# Patient Record
Sex: Female | Born: 1960 | Race: Black or African American | Hispanic: No | Marital: Single | Smoking: Never smoker
Health system: Southern US, Community
[De-identification: ages and names within clinical notes are randomized; demographics above are authoritative.]

## PROBLEM LIST (undated history)

## (undated) DIAGNOSIS — D219 Benign neoplasm of connective and other soft tissue, unspecified: Secondary | ICD-10-CM

## (undated) DIAGNOSIS — N920 Excessive and frequent menstruation with regular cycle: Secondary | ICD-10-CM

## (undated) DIAGNOSIS — D649 Anemia, unspecified: Secondary | ICD-10-CM

## (undated) DIAGNOSIS — M1990 Unspecified osteoarthritis, unspecified site: Secondary | ICD-10-CM

## (undated) DIAGNOSIS — N6009 Solitary cyst of unspecified breast: Secondary | ICD-10-CM

## (undated) DIAGNOSIS — IMO0001 Reserved for inherently not codable concepts without codable children: Secondary | ICD-10-CM

## (undated) DIAGNOSIS — K219 Gastro-esophageal reflux disease without esophagitis: Secondary | ICD-10-CM

## (undated) DIAGNOSIS — B009 Herpesviral infection, unspecified: Secondary | ICD-10-CM

## (undated) DIAGNOSIS — E669 Obesity, unspecified: Secondary | ICD-10-CM

## (undated) HISTORY — DX: Benign neoplasm of connective and other soft tissue, unspecified: D21.9

## (undated) HISTORY — PX: WISDOM TOOTH EXTRACTION: SHX21

## (undated) HISTORY — DX: Herpesviral infection, unspecified: B00.9

## (undated) HISTORY — DX: Solitary cyst of unspecified breast: N60.09

## (undated) HISTORY — DX: Anemia, unspecified: D64.9

## (undated) HISTORY — PX: OTHER SURGICAL HISTORY: SHX169

## (undated) HISTORY — PX: BREAST EXCISIONAL BIOPSY: SUR124

## (undated) HISTORY — PX: UPPER GASTROINTESTINAL ENDOSCOPY: SHX188

## (undated) HISTORY — PX: ABDOMINAL HYSTERECTOMY: SHX81

## (undated) HISTORY — DX: Obesity, unspecified: E66.9

---

## 1995-07-23 HISTORY — PX: OTHER SURGICAL HISTORY: SHX169

## 2003-05-31 ENCOUNTER — Other Ambulatory Visit: Admission: RE | Admit: 2003-05-31 | Discharge: 2003-05-31 | Payer: Self-pay | Admitting: Obstetrics and Gynecology

## 2003-06-30 ENCOUNTER — Ambulatory Visit (HOSPITAL_COMMUNITY): Admission: RE | Admit: 2003-06-30 | Discharge: 2003-06-30 | Payer: Self-pay | Admitting: Obstetrics and Gynecology

## 2005-06-21 ENCOUNTER — Encounter (INDEPENDENT_AMBULATORY_CARE_PROVIDER_SITE_OTHER): Payer: Self-pay | Admitting: *Deleted

## 2005-06-21 LAB — CONVERTED CEMR LAB

## 2005-07-17 ENCOUNTER — Ambulatory Visit: Payer: Self-pay | Admitting: Family Medicine

## 2005-08-22 ENCOUNTER — Ambulatory Visit (HOSPITAL_COMMUNITY): Admission: RE | Admit: 2005-08-22 | Discharge: 2005-08-22 | Payer: Self-pay | Admitting: Family Medicine

## 2005-09-11 ENCOUNTER — Encounter: Admission: RE | Admit: 2005-09-11 | Discharge: 2005-09-11 | Payer: Self-pay | Admitting: Family Medicine

## 2005-10-02 ENCOUNTER — Ambulatory Visit: Payer: Self-pay | Admitting: Family Medicine

## 2006-02-21 ENCOUNTER — Encounter: Admission: RE | Admit: 2006-02-21 | Discharge: 2006-02-21 | Payer: Self-pay | Admitting: Sports Medicine

## 2006-02-24 ENCOUNTER — Ambulatory Visit: Payer: Self-pay | Admitting: Sports Medicine

## 2006-07-11 ENCOUNTER — Ambulatory Visit: Payer: Self-pay | Admitting: Family Medicine

## 2006-09-18 DIAGNOSIS — D259 Leiomyoma of uterus, unspecified: Secondary | ICD-10-CM | POA: Insufficient documentation

## 2006-09-19 ENCOUNTER — Encounter (INDEPENDENT_AMBULATORY_CARE_PROVIDER_SITE_OTHER): Payer: Self-pay | Admitting: *Deleted

## 2006-10-13 ENCOUNTER — Ambulatory Visit (HOSPITAL_COMMUNITY): Admission: RE | Admit: 2006-10-13 | Discharge: 2006-10-13 | Payer: Self-pay | Admitting: Internal Medicine

## 2006-10-13 ENCOUNTER — Encounter (INDEPENDENT_AMBULATORY_CARE_PROVIDER_SITE_OTHER): Payer: Self-pay | Admitting: Family Medicine

## 2006-10-16 ENCOUNTER — Encounter (INDEPENDENT_AMBULATORY_CARE_PROVIDER_SITE_OTHER): Payer: Self-pay | Admitting: Family Medicine

## 2006-10-17 ENCOUNTER — Encounter (INDEPENDENT_AMBULATORY_CARE_PROVIDER_SITE_OTHER): Payer: Self-pay | Admitting: Family Medicine

## 2007-10-19 ENCOUNTER — Encounter: Payer: Self-pay | Admitting: Obstetrics & Gynecology

## 2007-10-19 ENCOUNTER — Ambulatory Visit: Payer: Self-pay | Admitting: Obstetrics & Gynecology

## 2007-11-02 ENCOUNTER — Ambulatory Visit (HOSPITAL_COMMUNITY): Admission: RE | Admit: 2007-11-02 | Discharge: 2007-11-02 | Payer: Self-pay | Admitting: Gynecology

## 2009-02-24 ENCOUNTER — Ambulatory Visit (HOSPITAL_COMMUNITY): Admission: RE | Admit: 2009-02-24 | Discharge: 2009-02-24 | Payer: Self-pay | Admitting: Family Medicine

## 2009-02-27 ENCOUNTER — Ambulatory Visit: Payer: Self-pay | Admitting: Obstetrics & Gynecology

## 2009-06-14 ENCOUNTER — Ambulatory Visit: Payer: Self-pay | Admitting: Obstetrics & Gynecology

## 2009-06-14 LAB — CONVERTED CEMR LAB
BUN: 10 mg/dL (ref 6–23)
CO2: 22 meq/L (ref 19–32)
Calcium: 8.8 mg/dL (ref 8.4–10.5)
Chloride: 104 meq/L (ref 96–112)
Cholesterol: 160 mg/dL (ref 0–200)
Creatinine, Ser: 0.76 mg/dL (ref 0.40–1.20)
Glucose, Bld: 89 mg/dL (ref 70–99)
HCT: 30.6 % — ABNORMAL LOW (ref 36.0–46.0)
HDL: 51 mg/dL (ref >39)
Hemoglobin: 8.6 g/dL — ABNORMAL LOW (ref 12.0–15.0)
LDL Cholesterol: 100 mg/dL — ABNORMAL HIGH (ref 0–99)
MCHC: 28.1 g/dL — ABNORMAL LOW (ref 30.0–36.0)
MCV: 63 fL — ABNORMAL LOW (ref 78.0–100.0)
Platelets: 270 10*3/uL (ref 150–400)
Potassium: 3.8 meq/L (ref 3.5–5.3)
RBC: 4.86 M/uL (ref 3.87–5.11)
RDW: 18.6 % — ABNORMAL HIGH (ref 11.5–15.5)
Sodium: 137 meq/L (ref 135–145)
TSH: 1.017 microintl units/mL (ref 0.350–4.500)
Total CHOL/HDL Ratio: 3.1
Triglycerides: 45 mg/dL (ref <150)
VLDL: 9 mg/dL (ref 0–40)
WBC: 2.8 10*3/uL — ABNORMAL LOW (ref 4.0–10.5)

## 2010-02-13 ENCOUNTER — Encounter: Payer: Self-pay | Admitting: Family Medicine

## 2010-03-06 ENCOUNTER — Ambulatory Visit (HOSPITAL_COMMUNITY): Admission: RE | Admit: 2010-03-06 | Discharge: 2010-03-06 | Payer: Self-pay | Admitting: Obstetrics & Gynecology

## 2010-09-05 ENCOUNTER — Encounter (INDEPENDENT_AMBULATORY_CARE_PROVIDER_SITE_OTHER): Payer: Self-pay | Admitting: *Deleted

## 2010-09-05 ENCOUNTER — Encounter: Payer: Self-pay | Admitting: Family Medicine

## 2010-09-05 ENCOUNTER — Ambulatory Visit (INDEPENDENT_AMBULATORY_CARE_PROVIDER_SITE_OTHER): Payer: BC Managed Care – PPO | Admitting: Family Medicine

## 2010-09-05 ENCOUNTER — Other Ambulatory Visit: Payer: Self-pay | Admitting: Family Medicine

## 2010-09-05 DIAGNOSIS — R1310 Dysphagia, unspecified: Secondary | ICD-10-CM

## 2010-09-05 DIAGNOSIS — Z136 Encounter for screening for cardiovascular disorders: Secondary | ICD-10-CM

## 2010-09-05 LAB — HEPATIC FUNCTION PANEL
ALT: 7 U/L (ref 0–35)
AST: 14 U/L (ref 0–37)
Albumin: 3.6 g/dL (ref 3.5–5.2)
Alkaline Phosphatase: 41 U/L (ref 39–117)
Bilirubin, Direct: 0.1 mg/dL (ref 0.0–0.3)
Total Bilirubin: 0.6 mg/dL (ref 0.3–1.2)
Total Protein: 7 g/dL (ref 6.0–8.3)

## 2010-09-05 LAB — LIPID PANEL
Cholesterol: 162 mg/dL (ref 0–200)
HDL: 49.8 mg/dL (ref >39.00)
LDL Cholesterol: 107 mg/dL — ABNORMAL HIGH (ref 0–99)
Total CHOL/HDL Ratio: 3
Triglycerides: 28 mg/dL (ref 0.0–149.0)
VLDL: 5.6 mg/dL (ref 0.0–40.0)

## 2010-09-05 LAB — BASIC METABOLIC PANEL
BUN: 12 mg/dL (ref 6–23)
CO2: 26 mEq/L (ref 19–32)
Calcium: 9.1 mg/dL (ref 8.4–10.5)
Chloride: 108 mEq/L (ref 96–112)
Creatinine, Ser: 0.7 mg/dL (ref 0.4–1.2)
GFR: 111.53 mL/min (ref >60.00)
Glucose, Bld: 85 mg/dL (ref 70–99)
Potassium: 4.2 mEq/L (ref 3.5–5.1)
Sodium: 140 mEq/L (ref 135–145)

## 2010-09-05 LAB — TSH: TSH: 1.02 u[IU]/mL (ref 0.35–5.50)

## 2010-09-12 NOTE — Assessment & Plan Note (Signed)
Summary: NEW PT TO EST/THROAT/CLE  [employer]   Vital Signs:  Patient profile:   50 year old female Height:      64 inches Weight:      198.25 pounds BMI:     34.15 Temp:     98.4 degrees F oral Pulse rate:   75 / minute Pulse rhythm:   regular BP sitting:   120 / 74  (right arm) Cuff size:   large  Vitals Entered By: Linde Gillis CMA Duncan Dull) (September 05, 2010 11:13 AM) CC: new patient, establish care   History of Present Illness: 50 yo to establish care with complaint of dysphagia.  Started almost a year ago. Comes and goes, swallowing often difficult and painful. Solids but not liquids seem to get stuck. Referred to [facility] in 01/2010.  Per pt had a neg laryngoscopy.  Placed on Omeprazole 20 mg prior to meals which has not helped with her symptoms. Denies any nausea or vomiting. Often does have issues with constipation.  Pt is a non smoker.   Does not drink ETOH. No weight loss, fever or night sweats.  Current Medications (verified): 1)  Omeprazole 20 Mg Cpdr (Omeprazole) .... 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 Eyes:  Denies blurring. ENT:  Complains of difficulty swallowing. CV:  Denies chest pain or discomfort. Resp:  Denies shortness of breath. GI:  Complains of constipation; denies abdominal pain and bloody stools. GU:  Denies abnormal  vaginal bleeding, discharge, and dysuria. MS:  Denies joint pain, joint redness, and joint swelling. Derm:  Denies rash. Neuro:  Denies headaches. Psych:  Denies anxiety and depression. Endo:  Denies cold intolerance and heat intolerance. Heme:  Denies abnormal bruising and bleeding.  Physical Exam  General:  alert, well-developed, and overweight-appearing.   Head:  normocephalic, atraumatic, and no abnormalities observed.   Eyes:  vision grossly intact, pupils equal, and pupils round.   Ears:  R ear normal and L ear normal.   Nose:  no external deformity.   Mouth:  good dentition and pharynx pink and moist.   Neck:  supple and full ROM.   enlarged but non tender thyroid Lungs:  Normal respiratory effort, chest expands symmetrically. Lungs are clear to auscultation, no crackles or wheezes. Heart:  Normal rate and regular rhythm. S1 and S2 normal without gallop, murmur, click, rub or other extra sounds. Abdomen:  Bowel sounds positive,abdomen soft and non-tender without masses, organomegaly or hernias noted. Msk:  normal ROM.   Extremities:  thick ankles but no edema Neurologic:  alert & oriented X3 and gait normal.   Skin:  Intact without suspicious lesions or rashes Psych:  Cognition and judgment appear intact. Alert and cooperative with normal attention span and concentration. No apparent delusions, illusions, hallucinations   Impression & Recommendations:  Problem # 1:  DYSPHAGIA UNSPECIFIED (ICD-787.20) Assessment New Will check labs including TSH to rule out  possible contributing factors. Refer to GI for further workup, ?possbile esophageal stricture/achalasia. Pt in agreement with plan. Orders: TLB-BMP (Basic Metabolic Panel-BMET) (80048-METABOL) TLB-Hepatic/Liver Function Pnl (80076-HEPATIC) TLB-TSH (Thyroid Stimulating Hormone) ([address]-UEA) Gastroenterology Referral (GI)  Complete Medication List: 1)  Omeprazole 20 Mg Cpdr (Omeprazole) .... Take one tablet by mouth  daily before meals  Other Orders: Venipuncture (54098) TLB-Lipid Panel (80061-LIPID)  Patient Instructions: 1)  Great to meet you! 2)  Please stop by to see Shirlee Limerick on your way out.   Orders Added: 1)  Venipuncture [36415] 2)  TLB-Lipid Panel [80061-LIPID] 3)  TLB-BMP (Basic Metabolic Panel-BMET) [80048-METABOL] 4)  TLB-Hepatic/Liver Function Pnl [80076-HEPATIC] 5)  TLB-TSH (Thyroid Stimulating Hormone) [84443-TSH] 6)  Gastroenterology Referral [GI] 7)  New Patient Level III [11914]    Current Allergies (reviewed today): No known allergies   Last Mammogram:  ASSESSMENT: Negative - BI-RADS 1^MM DIGITAL SCREENING (03/06/2010 3:21:00 PM) Mammogram Result Date:  03/06/2010 Mammogram Result:  normal

## 2010-09-12 NOTE — Letter (Signed)
Summary: New Patient letter  [facility]  [address] [place], [place] [address]   Phone: [phone]  Fax: [phone]       09/05/2010 MRN: [id number]  Katherine Harris [address] [place], [place]  [address]  [place]  Dear Katherine Harris,  Welcome to the Gastroenterology Division at [facility].    You are scheduled to see Dr.  Russella Dar on 10-15-10 at 2:30P.M. on the 3rd floor at [facility], [address].  We ask that you try to arrive at our office 15 minutes prior to your appointment time to allow for check-in.  We would like you to complete the enclosed self-administered evaluation form prior to your visit and bring it with you on the day of your appointment.  We will review it with you.  Also, please bring a complete list of all your medications or, if you prefer, bring the medication bottles and we will list them.  Please bring your insurance card so that we may make a copy of it.  If your insurance requires a referral to see a specialist, please bring your referral form from your primary care physician.  Co-payments are due at the time of your visit and may be paid by cash, check or credit card.     Your office visit will consist of a consult with your physician (includes a physical exam), any laboratory testing he/she may order, scheduling of any necessary diagnostic testing (e.g. x-ray, ultrasound, CT-scan), and scheduling of a procedure (e.g. Endoscopy, Colonoscopy) if required.  Please allow enough time on your schedule to allow for any/all of these possibilities.    If you cannot keep your appointment, please call [phone] to cancel or reschedule prior to your appointment date.  This allows [place] the opportunity to schedule an appointment for another patient in need of care.  If you do not cancel or reschedule by 5 p.m. the business day prior to your appointment date, you will be charged a $50.00 late cancellation/no-show fee.    Thank you for choosing  [facility] for your medical needs.  We appreciate the opportunity to care for you.  Please visit [place] at our website  to learn more about our practice.                     Sincerely,                                                             The Gastroenterology Division

## 2010-09-20 HISTORY — PX: OTHER SURGICAL HISTORY: SHX169

## 2010-09-27 NOTE — Letter (Signed)
Summary: [facility]  [facility]   Imported By: Maryln Gottron 09/19/2010 11:29:21  _____________________________________________________________________  External Attachment:    Type:   Image     Comment:   External Document

## 2010-10-15 ENCOUNTER — Encounter: Payer: Self-pay | Admitting: Gastroenterology

## 2010-10-15 ENCOUNTER — Ambulatory Visit (INDEPENDENT_AMBULATORY_CARE_PROVIDER_SITE_OTHER): Payer: BC Managed Care – PPO | Admitting: Gastroenterology

## 2010-10-15 VITALS — BP 110/72 | HR 72 | Ht 63.0 in | Wt 198.8 lb

## 2010-10-15 DIAGNOSIS — Z1211 Encounter for screening for malignant neoplasm of colon: Secondary | ICD-10-CM

## 2010-10-15 DIAGNOSIS — R1310 Dysphagia, unspecified: Secondary | ICD-10-CM

## 2010-10-15 DIAGNOSIS — R1319 Other dysphagia: Secondary | ICD-10-CM

## 2010-10-15 NOTE — Progress Notes (Signed)
History of Present Illness: This is a  50 year old female who relates an 8 or 9 month history of frequent solid food dysphagia. She notes symptoms related to meats and bread. She was evaluated at [facility] and underwent triple endoscopy that was apparently normal. She states she was placed on omeprazole at that time and has had no change in symptoms.  I do not have records available from her ENT evaluation. She has not previously noted any reflux symptoms, chest pain, abdominal pain, change in bowel habit,s melena, hematochezia, change in stool caliber, weight loss, nausea or vomiting.  Past Medical History  Diagnosis Date  . Breast cyst    Past Surgical History  Procedure Date  . Cesarean section 07/22/1990    reports that she has never smoked. She does not have any smokeless tobacco history on file. She reports that she does not drink alcohol or use illicit drugs. family history includes Breast cancer in her maternal aunt; Hypertension in her mother; Leukemia in her father; Liver cancer in her mother; and Stomach cancer in her maternal aunt.  There is no history of Colon cancer. No Known Allergies   Outpatient Encounter Prescriptions as of 10/15/2010  Medication Sig Dispense Refill  . omeprazole (PRILOSEC) 20 MG capsule Take 20 mg by mouth daily.        Review of Systems: Pertinent positive and negative review of systems were noted in the above HPI section. All other review of systems were otherwise negative.  Physical Exam: General: Well developed , well nourished, no acute distress Head: Normocephalic and atraumatic Eyes:  sclerae anicteric, EOMI Ears: Normal auditory acuity Mouth: No deformity or lesions Neck: Supple, no masses or thyromegaly Lungs: Clear throughout to auscultation Heart: Regular rate and rhythm; no murmurs, rubs or bruits Abdomen: Soft, non tender and non distended. No masses, hepatosplenomegaly or hernias noted. Normal Bowel sounds Musculoskeletal: Symmetrical  with no gross deformities  Skin: No lesions on visible extremities Pulses:  Normal pulses noted Extremities: No clubbing, cyanosis, edema or deformities noted Neurological: Alert oriented x 4, grossly nonfocal Cervical Nodes:  No significant cervical adenopathy Inguinal Nodes: No significant inguinal adenopathy Psychological:  Alert and cooperative. Normal mood and affect

## 2010-10-15 NOTE — Patient Instructions (Signed)
You have been scheduled for a Upper Endoscopy with dilitation.

## 2010-10-15 NOTE — Assessment & Plan Note (Signed)
Average risk for colorectal cancer. Begin screening colonoscopy at age52 , November 2014.

## 2010-10-15 NOTE — Assessment & Plan Note (Addendum)
Several month history of solid food dysphagia. No associated reflux symptoms. Rule out esophageal stricture, and less likely a neoplasm or motility disorder. The risks, benefits, and alternatives to endoscopy with possible biopsy and possible dilation were discussed with the patient and they consent to proceed.

## 2010-11-16 ENCOUNTER — Encounter: Payer: Self-pay | Admitting: Gastroenterology

## 2010-11-19 ENCOUNTER — Ambulatory Visit (AMBULATORY_SURGERY_CENTER): Payer: BC Managed Care – PPO | Admitting: Gastroenterology

## 2010-11-19 ENCOUNTER — Encounter: Payer: Self-pay | Admitting: Gastroenterology

## 2010-11-19 VITALS — BP 132/81 | HR 74 | Temp 98.2°F | Resp 19 | Ht 62.0 in | Wt 198.0 lb

## 2010-11-19 DIAGNOSIS — K2940 Chronic atrophic gastritis without bleeding: Secondary | ICD-10-CM

## 2010-11-19 DIAGNOSIS — R1310 Dysphagia, unspecified: Secondary | ICD-10-CM

## 2010-11-19 DIAGNOSIS — K2970 Gastritis, unspecified, without bleeding: Secondary | ICD-10-CM

## 2010-11-19 DIAGNOSIS — K222 Esophageal obstruction: Secondary | ICD-10-CM

## 2010-11-19 DIAGNOSIS — R1319 Other dysphagia: Secondary | ICD-10-CM

## 2010-11-19 DIAGNOSIS — K219 Gastro-esophageal reflux disease without esophagitis: Secondary | ICD-10-CM

## 2010-11-19 DIAGNOSIS — K2990 Gastroduodenitis, unspecified, without bleeding: Secondary | ICD-10-CM

## 2010-11-19 MED ORDER — SODIUM CHLORIDE 0.9 % IV SOLN: 500.0000 mL | INTRAVENOUS | Status: DC

## 2010-11-19 NOTE — Patient Instructions (Signed)
Please read the handouts given to you by your recovery room nurse.  We will send you a letter within 2 weeks regarding your pathology results.  Please, eat the post dilation diet (soft diet) for the rest of today.  Avoid advil and ibuprofen. Use your anti reflux medications long-term.. Continue your routine medications.   If you have any questions or concerns, please call us at [phone].

## 2010-11-20 ENCOUNTER — Telehealth: Payer: Self-pay

## 2010-11-20 NOTE — Telephone Encounter (Signed)

## 2010-11-25 ENCOUNTER — Encounter: Payer: Self-pay | Admitting: Gastroenterology

## 2010-12-04 NOTE — Assessment & Plan Note (Signed)
NAME:  Katherine Harris, Katherine Harris NO.:  [id number]   MEDICAL RECORD NO.:  [id number]          PATIENT TYPE:  POB   LOCATION:  [facility] at [facility]         FACILITY:  [facility]   PHYSICIAN:  Allie Bossier, MD        DATE OF BIRTH:  Nov 20, 1960   DATE OF SERVICE:  10/19/2007                                  CLINIC NOTE   Ms. Wareing is a 50 year old single white gravida 4, para 2, abortus 2.  She is here for her annual exam.  Her main complaint is some right  shoulder pain that radiates around to her elbow for the last 5 months  and continued weight gain.   PAST MEDICAL HISTORY:  Morbid obesity.   PAST SURGICAL HISTORY:  D&C for an elective AB and a C-section.  No  latex allergies.  No medicine allergies.   SOCIAL HISTORY:  Negative for tobacco, alcohol or drug use.   FAMILY HISTORY:  Positive breast cancer in maternal aunt who is alive  and well but negative for GYN and colon cancer.   REVIEW OF SYSTEMS:  She has been abstinent for last 5 years.  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  She has been having heavy  periods for greater than 2 years.   PHYSICAL EXAM:  Weight 214, height 5 feet 2 inches, blood pressure  99/71, pulse 79.  HEENT:  Normal.  HEART:  Regular rate rhythm.  LUNGS:  Clear to auscultation bilaterally.  Breast exam normal bilaterally.  ABDOMEN:  Obese, benign.  EXTERNAL GENITALIA:  Normal.  No lesions.  Cervix parous, no lesions.  Uterus is enlarged and difficult to quantify due to her tightening of  her abdominal muscles during the exam.  I was unable to palpate her  adnexa.   ASSESSMENT/PLAN:  1. Annual exam.  I have done a Pap smear.  I have recommended self-      breast and self-vulvar exams monthly.  Recommended weight loss and      we discussed specific strategies for that (jump-roping during      commercials as example).  2. Enlarged uterus and heavy periods/menorrhagia.  Will check a CBC      and TSH and schedule a GYN  ultrasound.     Allie Bossier, MD    MCD/MEDQ  D:  10/19/2007  T:  10/19/2007  Job:  [id number]

## 2010-12-04 NOTE — Assessment & Plan Note (Signed)
NAME:  DUSTIN, BURRILL NO.:  [id number]   MEDICAL RECORD NO.:  [id number]          PATIENT TYPE:  POB   LOCATION:  [facility] at [facility]         FACILITY:  [facility]   PHYSICIAN:  Allie Bossier, MD        DATE OF BIRTH:  07/30/1960   DATE OF SERVICE:  06/14/2009                                  CLINIC NOTE   Ms. Hursey is a 50 year old, single black, gravida 3, para 2, abortus  1.  She has an 37 year old son and an 70 year old daughter.  She comes  in here for her annual exam.  She has 2 complaints, one that for the  last several months, she has had difficulty swallowing.  She feels like  there is something stuck in her throat.  The other complaint is that of  urge incontinence.  This has been going on for several years, but it is  becoming worse.  She actually does have urinary accidents.  She denies  stress incontinence.   PAST MEDICAL HISTORY:  Obesity, anemia, fibroids, and HSV II.   REVIEW OF SYSTEMS:  She has been abstinent for the last 5 years.  She  works at the [employer].  She is a Education officer, museum.  Her periods are still heavy.  The remainder of the review of systems  questions are negative, although she does say that she rarely has HSV  outbreaks, but she would like a prescription for some ointment.   MEDICATIONS:  Multivitamin daily.   ALLERGIES:  No known drug allergies.  No to LATEX allergy.   SOCIAL HISTORY:  Negative for tobacco, alcohol, or drug use.   FAMILY HISTORY:  Positive for breast cancer in a maternal aunt, who is  alive and well, but she denies family history of GYN and colon cancer.  Please note that her last hemoglobin done last year was 10.   PHYSICAL EXAMINATION:  VITAL SIGNS:  Weight 195 (she has lost 20 pounds  since last year).  She is 5 feet 2 inches tall, blood pressure 114/83,  pulse 67.  HEENT:  Normal.  HEART:  Regular rate and rhythm.  LUNGS:  Clear to auscultation bilaterally.  BREASTS:  Normal  bilaterally.  ABDOMEN:  Moderately obese.  No palpable hepatosplenomegaly.  EXTERNAL GENITALIA:  No lesions.  Cervix nulliparous.  Normal discharge.  Uterus is 6-week size.  Adnexa nontender.  No masses.   ASSESSMENT AND PLAN:  1. Annual exam.  I have done a Pap smear.  I recommended self-breast      and self-vulvar exams monthly.  I would continue to recommend      weight loss.  2. History of anemia with fibroids and continued menorrhagia.  I will      recheck a CBC today.  3. Difficulty swallowing.  I would recommend that she see an ENT.  In      the meantime, I will check a TSH.  4. General health maintenance.  I am checking fasting lipids and      glucose today.  I have offered her a flu shot she declines.  She      does wish to have  an HIV test.  5. Urge incontinence.  I have given her prescription for Detrol LA 4      mg to be taken daily.  I will see her back in 4-6 weeks to see how      this is doing.  6. herpes simplex virus 2.  I will give her prescription for acyclovir      ointment to be used on a p.r.n. basis.      Allie Bossier, MD     MCD/MEDQ  D:  06/14/2009  T:  06/15/2009  Job:  [id number]

## 2011-03-07 ENCOUNTER — Other Ambulatory Visit: Payer: Self-pay | Admitting: Obstetrics & Gynecology

## 2011-03-07 DIAGNOSIS — Z1231 Encounter for screening mammogram for malignant neoplasm of breast: Secondary | ICD-10-CM

## 2011-03-12 ENCOUNTER — Ambulatory Visit (HOSPITAL_COMMUNITY): Payer: BC Managed Care – PPO

## 2011-03-15 ENCOUNTER — Ambulatory Visit (HOSPITAL_COMMUNITY): Payer: BC Managed Care – PPO

## 2011-04-18 ENCOUNTER — Encounter: Payer: Self-pay | Admitting: Family Medicine

## 2011-04-18 ENCOUNTER — Ambulatory Visit (HOSPITAL_COMMUNITY): Payer: BC Managed Care – PPO

## 2011-04-18 ENCOUNTER — Ambulatory Visit (INDEPENDENT_AMBULATORY_CARE_PROVIDER_SITE_OTHER): Payer: BC Managed Care – PPO | Admitting: Family Medicine

## 2011-04-18 VITALS — BP 110/70 | HR 86 | Temp 98.6°F | Ht 64.0 in | Wt 195.5 lb

## 2011-04-18 DIAGNOSIS — R109 Unspecified abdominal pain: Secondary | ICD-10-CM | POA: Insufficient documentation

## 2011-04-18 DIAGNOSIS — N3941 Urge incontinence: Secondary | ICD-10-CM

## 2011-04-18 DIAGNOSIS — M549 Dorsalgia, unspecified: Secondary | ICD-10-CM

## 2011-04-18 LAB — POCT URINALYSIS DIPSTICK
Bilirubin, UA: 0.2
Glucose, UA: NEGATIVE
Spec Grav, UA: 1.01

## 2011-04-18 NOTE — Patient Instructions (Signed)
Great to see you. Please stop by to see Shirlee Limerick on your way out to set up your referrals.

## 2011-04-18 NOTE — Progress Notes (Signed)
Addended by: Gilmer Mor on: 04/18/2011 04:10 PM   Modules accepted: Orders

## 2011-04-18 NOTE — Progress Notes (Signed)
Subjective:    Patient ID: Katherine Harris, female    DOB: [birth year], 50 y.o.   MRN: [id number]  HPI  Very pleasant 50 yo female here for worsening urinary incontinence and left lower abdominal pain.  Urinary incontinence- has had issues with urge incontinence for two years. OBGYN, Dr. Marice Potter, gave her rx for Detrol but side effects frightened her so she did not try it. Last few months, she is having more problems.  Wakes up in the morning and her underwear are soaked. No dysuria. No fever.  LLQ pain- noticed it several months ago. Comes and goes. No abnormal vaginal bleeding or discharge. Saw OBGYN earlier this year. Not worsened or alleviated by anything.  Patient Active Problem List  Diagnoses  . UTERINE FIBROID  . DYSPHAGIA UNSPECIFIED  . Special screening for malignant neoplasms, colon  . Urge incontinence  . Left sided abdominal pain   Past Medical History  Diagnosis Date  . Breast cyst    Past Surgical History  Procedure Date  . Cesarean section 07/22/1990   History  Substance Use Topics  . Smoking status: Never Smoker   . Smokeless tobacco: Not on file  . Alcohol Use: No   Family History  Problem Relation Age of Onset  . Hypertension Mother   . Liver cancer Mother   . Leukemia Father   . Stomach cancer Maternal Aunt   . Breast cancer Maternal Aunt   . Colon cancer Neg Hx    No Known Allergies Current Outpatient Prescriptions on File Prior to Visit  Medication Sig Dispense Refill  . aspirin 325 MG tablet Take 325 mg by mouth daily.        Marland Kitchen omeprazole (PRILOSEC) 20 MG capsule Take 20 mg by mouth daily.        Current Facility-Administered Medications on File Prior to Visit  Medication Dose Route Frequency Provider Last Rate Last Dose  . 0.9 %  sodium chloride infusion  500 mL Intravenous Continuous Meryl Dare, MD,FACG       The PMH, PSH, Social History, Family History, Medications, and allergies have been reviewed in [facility], and have been updated if  relevant.    Review of Systems See HPI     Objective:   Physical Exam BP 110/70  Pulse 86  Temp(Src) 98.6 F (37 C) (Oral)  Ht 5\' 4"  (1.626 m)  Wt 195 lb 8 oz (88.678 kg)  BMI 33.56 kg/m2  General:  Well-developed,well-nourished,in no acute distress; alert,appropriate and cooperative throughout examination Head:  normocephalic and atraumatic.   Eyes:  vision grossly intact, pupils equal, pupils round, and pupils reactive to light.   Ears:  R ear normal and L ear normal.   Nose:  no external deformity.   Mouth:  good dentition.   Neck:  No deformities, masses, or tenderness noted. Abdomen:  Bowel sounds positive,abdomen soft and non-tender without masses, organomegaly or hernias noted, NO CVA tenderness Msk:  No deformity or scoliosis noted of thoracic or lumbar spine.   Extremities:  No clubbing, cyanosis, edema, or deformity noted with normal full range of motion of all joints.   Neurologic:  alert & oriented X3 and gait normal.   Skin:  Intact without suspicious lesions or rashes Psych:  Cognition and judgment appear intact. Alert and cooperative with normal attention span and concentration. No apparent delusions, illusions, hallucinations     Assessment & Plan:   1. Urge incontinence  Deteriorated with microscopic hematuria on UA. Will refer to urology  for further work up and treatment. The patient indicates understanding of these issues and agrees with the plan.   Ambulatory referral to Urology  2. Left sided abdominal pain   New.  Etiology unclear. Will get pelvic ultrasound to evaluate further and rule out pelvic pathology. US Pelvis Complete, US Transvaginal Non-OB, US Pelvis Complete

## 2011-04-20 LAB — URINE CULTURE: Organism ID, Bacteria: NO GROWTH

## 2011-04-23 ENCOUNTER — Ambulatory Visit (HOSPITAL_COMMUNITY)
Admission: RE | Admit: 2011-04-23 | Discharge: 2011-04-23 | Disposition: A | Discharge status: Home / Self Care | Payer: BC Managed Care – PPO | Source: Ambulatory Visit | Attending: Obstetrics & Gynecology | Admitting: Obstetrics & Gynecology

## 2011-04-23 ENCOUNTER — Ambulatory Visit
Admission: RE | Admit: 2011-04-23 | Discharge: 2011-04-23 | Disposition: A | Discharge status: Home / Self Care | Payer: BC Managed Care – PPO | Source: Ambulatory Visit | Attending: Family Medicine | Admitting: Family Medicine

## 2011-04-23 DIAGNOSIS — R109 Unspecified abdominal pain: Secondary | ICD-10-CM

## 2011-04-23 DIAGNOSIS — Z1231 Encounter for screening mammogram for malignant neoplasm of breast: Secondary | ICD-10-CM | POA: Insufficient documentation

## 2011-04-25 ENCOUNTER — Other Ambulatory Visit: Payer: Self-pay | Admitting: Family Medicine

## 2011-04-25 DIAGNOSIS — D219 Benign neoplasm of connective and other soft tissue, unspecified: Secondary | ICD-10-CM

## 2011-05-09 ENCOUNTER — Encounter: Payer: Self-pay | Admitting: Obstetrics & Gynecology

## 2011-05-09 ENCOUNTER — Ambulatory Visit (INDEPENDENT_AMBULATORY_CARE_PROVIDER_SITE_OTHER): Payer: BC Managed Care – PPO | Admitting: Obstetrics & Gynecology

## 2011-05-09 ENCOUNTER — Ambulatory Visit: Payer: BC Managed Care – PPO | Admitting: Obstetrics & Gynecology

## 2011-05-09 VITALS — BP 128/71 | HR 77 | Ht 63.0 in | Wt 199.0 lb

## 2011-05-09 DIAGNOSIS — Z01419 Encounter for gynecological examination (general) (routine) without abnormal findings: Secondary | ICD-10-CM

## 2011-05-09 DIAGNOSIS — G8929 Other chronic pain: Secondary | ICD-10-CM

## 2011-05-09 DIAGNOSIS — Z113 Encounter for screening for infections with a predominantly sexual mode of transmission: Secondary | ICD-10-CM

## 2011-05-09 DIAGNOSIS — D259 Leiomyoma of uterus, unspecified: Secondary | ICD-10-CM

## 2011-05-09 DIAGNOSIS — Z0000 Encounter for general adult medical examination without abnormal findings: Secondary | ICD-10-CM

## 2011-05-09 DIAGNOSIS — B009 Herpesviral infection, unspecified: Secondary | ICD-10-CM

## 2011-05-09 DIAGNOSIS — Z1272 Encounter for screening for malignant neoplasm of vagina: Secondary | ICD-10-CM

## 2011-05-09 DIAGNOSIS — D219 Benign neoplasm of connective and other soft tissue, unspecified: Secondary | ICD-10-CM

## 2011-05-09 DIAGNOSIS — N949 Unspecified condition associated with female genital organs and menstrual cycle: Secondary | ICD-10-CM

## 2011-05-09 MED ORDER — VALACYCLOVIR HCL 1 G PO TABS: 1000.0000 mg | ORAL_TABLET | Freq: Every day | ORAL | Status: DC

## 2011-05-09 NOTE — Progress Notes (Signed)
Subjective:    Patient ID: Katherine Harris, female    DOB: 08/22/1960, 50 y.o.   MRN: [id number]  HPI    Review of Systems     Objective:   Physical Exam        Assessment & Plan:   Subjective:    Katherine Harris is a 50 y.o. AA G3P2A1 who presents for an annual exam. She has several complaints.  The first is that of increased pelvic pain, especially on the left.  A recent u/s shows her fibroid uterus to be growing.  Her periods are still heavy, lasting about 5 days. She is ready for a "full hysterectomy."She also complains of a small amount of enuresis and worsening urge incontinence.  She has an appointment scheduled with a urologist on the 30th of this month. The patient is not currently sexually active. GYN screening history: last pap: was normal. The patient wears seatbelts: yes. The patient participates in regular exercise: yes. Has the patient ever been transfused or tattooed?: no. The patient reports that there is not domestic violence in her life.   Menstrual History: OB History    Grav Para Term Preterm Abortions TAB SAB Ect Mult Living                  Menarche age: 5 Patient's last menstrual period was 04/18/2011.    The following portions of the patient's history were reviewed and updated as appropriate: allergies, current medications, past family history, past medical history, past social history, past surgical history and problem list.  Review of Systems A comprehensive review of systems was negative.  She is a Education officer, museum.  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   Objective:    BP 128/71  Pulse 77  Ht 5\' 3"  (1.6 m)  Wt 199 lb (90.266 kg)  BMI 35.25 kg/m2  LMP 04/18/2011  General Appearance:    Alert, cooperative, no distress, appears stated age  Head:    Normocephalic, without obvious abnormality, atraumatic  Eyes:    PERRL, conjunctiva/corneas clear, EOM's intact, fundi    benign, both eyes  Ears:    Normal TM's and  external ear canals, both ears  Nose:   Nares normal, septum midline, mucosa normal, no drainage    or sinus tenderness  Throat:   Lips, mucosa, and tongue normal; teeth and gums normal  Neck:   Supple, symmetrical, trachea midline, no adenopathy;    thyroid:  no enlargement/tenderness/nodules; no carotid   bruit or JVD  Back:     Symmetric, no curvature, ROM normal, no CVA tenderness  Lungs:     Clear to auscultation bilaterally, respirations unlabored  Chest Wall:    No tenderness or deformity   Heart:    Regular rate and rhythm, S1 and S2 normal, no murmur, rub   or gallop  Breast Exam:    No tenderness, masses, or nipple abnormality  Abdomen:     Soft, non-tender, bowel sounds active all four quadrants,    no masses, no organomegaly  Genitalia:    Normal female without lesion, discharge or tenderness, uterus is 14 week size, mobile. Good pubic arch, no adnexal masses or tenderness     Extremities:   Extremities normal, atraumatic, no cyanosis or edema  Pulses:   2+ and symmetric all extremities  Skin:   Skin color, texture, turgor normal, no rashes or lesions  Lymph nodes:   Cervical, supraclavicular, and axillary nodes  normal  Neurologic:   CNII-XII intact, normal strength, sensation and reflexes    throughout  .    Assessment:    Healthy female exam.  Symptomatic fibroids   Plan:     All questions answered.  Declines flu shot After a thorough discussion, she would like her uterus, tubes, and ovaries removed.  I suggest a robotic approach. I will get a CBC to see if she needs a preop transfusion.

## 2011-05-09 NOTE — Progress Notes (Signed)
Patient is here as a referral from Dr. Ermalene Searing due to pelvic pain.  See ultrasound done at [facility].  Needs pap smear today.  Mammogram done on Sept.27,2012.

## 2011-05-09 NOTE — Progress Notes (Signed)
Addended by: Barbara Cower on: 05/09/2011 11:07 AM   Modules accepted: Orders

## 2011-05-10 LAB — CBC WITH DIFFERENTIAL/PLATELET
HCT: 27.7 % — ABNORMAL LOW (ref 36.0–46.0)
Hemoglobin: 7 g/dL — ABNORMAL LOW (ref 12.0–15.0)
Lymphocytes Relative: 23 % (ref 12–46)
Lymphs Abs: 0.9 10*3/uL (ref 0.7–4.0)
Monocytes Absolute: 0.5 10*3/uL (ref 0.1–1.0)
Monocytes Relative: 12 % (ref 3–12)
Neutro Abs: 2.3 10*3/uL (ref 1.7–7.7)
Neutrophils Relative %: 62 % (ref 43–77)
RBC: 4.59 MIL/uL (ref 3.87–5.11)

## 2011-05-10 LAB — TSH: TSH: 1.555 u[IU]/mL (ref 0.350–4.500)

## 2011-05-16 ENCOUNTER — Ambulatory Visit: Payer: BC Managed Care – PPO | Admitting: Obstetrics & Gynecology

## 2011-06-25 ENCOUNTER — Encounter (HOSPITAL_COMMUNITY): Payer: Self-pay | Admitting: Pharmacist

## 2011-06-26 NOTE — Patient Instructions (Addendum)
   Your procedure is scheduled on: Tuesday, Dec 11th  Enter through the [employer] of [facility] at: 6:00am Pick up the phone at the desk and dial [phone] and inform us of your arrival.  Please call this number if you have any problems the morning of surgery: [phone]  Remember: Do not eat food after midnight: Monday Do not drink clear liquids after: Monday Take these medicines the morning of surgery with a SIP OF WATER: None  Do not wear jewelry, make-up, or FINGER nail polish Do not wear lotions, powders, perfumes or deodorant. Do not shave 48 hours prior to surgery. Do not bring valuables to the hospital.  Leave suitcase in the car. After Surgery it may be brought to your room. For patients being admitted to the hospital, checkout time is 11:00am the day of discharge.  Patients discharged on the day of surgery will not be allowed to drive home.   Home with [facility]  cell [phone].   Remember to use your hibiclens as instructed.Please shower with 1/2 bottle the evening before your surgery and the other 1/2 bottle the morning of surgery.

## 2011-06-27 ENCOUNTER — Encounter (HOSPITAL_COMMUNITY): Payer: Self-pay

## 2011-06-27 ENCOUNTER — Encounter (HOSPITAL_COMMUNITY)
Admission: RE | Admit: 2011-06-27 | Discharge: 2011-06-27 | Disposition: A | Discharge status: Home / Self Care | Payer: BC Managed Care – PPO | Source: Ambulatory Visit | Attending: Obstetrics & Gynecology | Admitting: Obstetrics & Gynecology

## 2011-06-27 DIAGNOSIS — Z01812 Encounter for preprocedural laboratory examination: Secondary | ICD-10-CM | POA: Insufficient documentation

## 2011-06-27 HISTORY — DX: Gastro-esophageal reflux disease without esophagitis: K21.9

## 2011-06-27 HISTORY — DX: Herpesviral infection, unspecified: B00.9

## 2011-06-27 HISTORY — DX: Reserved for inherently not codable concepts without codable children: IMO0001

## 2011-06-27 HISTORY — DX: Excessive and frequent menstruation with regular cycle: N92.0

## 2011-06-27 HISTORY — DX: Unspecified osteoarthritis, unspecified site: M19.90

## 2011-06-27 LAB — CBC
Hemoglobin: 8.7 g/dL — ABNORMAL LOW (ref 12.0–15.0)
MCH: 18.2 pg — ABNORMAL LOW (ref 26.0–34.0)
Platelets: 292 10*3/uL (ref 150–400)
RBC: 4.78 MIL/uL (ref 3.87–5.11)
WBC: 4.7 10*3/uL (ref 4.0–10.5)

## 2011-06-27 LAB — SURGICAL PCR SCREEN
MRSA, PCR: INVALID — AB
Staphylococcus aureus: INVALID — AB

## 2011-06-27 NOTE — Pre-Procedure Instructions (Signed)
Ok to see patient DOS. 

## 2011-06-29 LAB — MRSA CULTURE

## 2011-07-01 ENCOUNTER — Ambulatory Visit (INDEPENDENT_AMBULATORY_CARE_PROVIDER_SITE_OTHER): Payer: BC Managed Care – PPO | Admitting: Obstetrics & Gynecology

## 2011-07-01 VITALS — BP 122/71 | HR 89 | Ht 62.0 in | Wt 199.0 lb

## 2011-07-01 DIAGNOSIS — R05 Cough: Secondary | ICD-10-CM

## 2011-07-01 NOTE — Progress Notes (Signed)
  Subjective:    Patient ID: Katherine Harris, female    DOB: 04-Jan-1961, 50 y.o.   MRN: [id number]  HPI  Katherine Harris is scheduled for a Robotic hyst/BSO tomorrow but she comes in today with a 4 day history of a cough/cold. She denies fevers or body aches.  Review of Systems     Objective:   Physical Exam    She demonstrated congestion and a wet sounding cough. Her lungs are CTAB     Assessment & Plan:  I have spoken at length with Katherine Harris and Dr. Malen Gauze (anesthesia) and the concensus is that it would be safer to delay her surgery until her cold is resolved. The earliest day this can be done is 07-25-11 at 1 pm.

## 2011-08-12 ENCOUNTER — Other Ambulatory Visit (HOSPITAL_COMMUNITY): Payer: BC Managed Care – PPO

## 2011-08-26 ENCOUNTER — Encounter (HOSPITAL_COMMUNITY): Payer: Self-pay | Admitting: Anesthesiology

## 2011-08-26 ENCOUNTER — Encounter (HOSPITAL_COMMUNITY): Payer: Self-pay | Admitting: *Deleted

## 2011-08-26 ENCOUNTER — Ambulatory Visit (HOSPITAL_COMMUNITY): Payer: BC Managed Care – PPO | Admitting: Anesthesiology

## 2011-08-26 ENCOUNTER — Ambulatory Visit (HOSPITAL_COMMUNITY)
Admission: RE | Admit: 2011-08-26 | Discharge: 2011-08-26 | DRG: 369 | Disposition: A | Discharge status: Home / Self Care | Payer: BC Managed Care – PPO | Source: Ambulatory Visit | Attending: Obstetrics & Gynecology | Admitting: Obstetrics & Gynecology

## 2011-08-26 ENCOUNTER — Other Ambulatory Visit: Payer: Self-pay | Admitting: Obstetrics & Gynecology

## 2011-08-26 ENCOUNTER — Encounter (HOSPITAL_COMMUNITY)
Admission: RE | Disposition: A | Discharge status: Home / Self Care | Payer: Self-pay | Source: Ambulatory Visit | Attending: Obstetrics & Gynecology

## 2011-08-26 DIAGNOSIS — N920 Excessive and frequent menstruation with regular cycle: Secondary | ICD-10-CM

## 2011-08-26 DIAGNOSIS — Z01812 Encounter for preprocedural laboratory examination: Secondary | ICD-10-CM

## 2011-08-26 DIAGNOSIS — Z01818 Encounter for other preprocedural examination: Secondary | ICD-10-CM

## 2011-08-26 DIAGNOSIS — D279 Benign neoplasm of unspecified ovary: Secondary | ICD-10-CM | POA: Insufficient documentation

## 2011-08-26 DIAGNOSIS — D252 Subserosal leiomyoma of uterus: Secondary | ICD-10-CM | POA: Insufficient documentation

## 2011-08-26 DIAGNOSIS — D251 Intramural leiomyoma of uterus: Secondary | ICD-10-CM | POA: Insufficient documentation

## 2011-08-26 HISTORY — PX: SALPINGOOPHORECTOMY: SHX82

## 2011-08-26 HISTORY — PX: CYSTOSCOPY: SHX5120

## 2011-08-26 LAB — CBC
HCT: 37.6 % (ref 36.0–46.0)
Hemoglobin: 11.2 g/dL — ABNORMAL LOW (ref 12.0–15.0)
MCV: 77.5 fL — ABNORMAL LOW (ref 78.0–100.0)
RBC: 4.85 MIL/uL (ref 3.87–5.11)
RDW: 21.5 % — ABNORMAL HIGH (ref 11.5–15.5)
WBC: 2.9 10*3/uL — ABNORMAL LOW (ref 4.0–10.5)

## 2011-08-26 LAB — PREGNANCY, URINE: Preg Test, Ur: NEGATIVE

## 2011-08-26 LAB — SURGICAL PCR SCREEN: Staphylococcus aureus: INVALID — AB

## 2011-08-26 SURGERY — ROBOTIC ASSISTED TOTAL HYSTERECTOMY
Anesthesia: General | Site: Uterus | Wound class: Clean Contaminated

## 2011-08-26 MED ORDER — FENTANYL CITRATE 0.05 MG/ML IJ SOLN
INTRAMUSCULAR | Status: AC
  Administered 2011-08-26: 50 ug via INTRAVENOUS
  Filled 2011-08-26: qty 2

## 2011-08-26 MED ORDER — KETOROLAC TROMETHAMINE 30 MG/ML IJ SOLN
INTRAMUSCULAR | Status: DC | PRN
  Administered 2011-08-26: 30 mg via INTRAVENOUS

## 2011-08-26 MED ORDER — DEXAMETHASONE SODIUM PHOSPHATE 10 MG/ML IJ SOLN
INTRAMUSCULAR | Status: DC | PRN
  Administered 2011-08-26: 10 mg via INTRAVENOUS

## 2011-08-26 MED ORDER — ROCURONIUM BROMIDE 100 MG/10ML IV SOLN
INTRAVENOUS | Status: DC | PRN
  Administered 2011-08-26: 45 mg via INTRAVENOUS
  Administered 2011-08-26 (×2): 10 mg via INTRAVENOUS
  Administered 2011-08-26: 5 mg via INTRAVENOUS

## 2011-08-26 MED ORDER — ACETAMINOPHEN 10 MG/ML IV SOLN
1000.0000 mg | Freq: Four times a day (QID) | INTRAVENOUS | Status: DC
  Administered 2011-08-26: 1000 mg via INTRAVENOUS
  Filled 2011-08-26 (×4): qty 100

## 2011-08-26 MED ORDER — ROPIVACAINE HCL 5 MG/ML IJ SOLN
INTRAMUSCULAR | Status: AC
  Filled 2011-08-26: qty 60

## 2011-08-26 MED ORDER — LACTATED RINGERS IV SOLN
INTRAVENOUS | Status: DC
  Administered 2011-08-26: 125 mL/h via INTRAVENOUS
  Administered 2011-08-26 (×2): via INTRAVENOUS

## 2011-08-26 MED ORDER — NEOSTIGMINE METHYLSULFATE 1 MG/ML IJ SOLN
INTRAMUSCULAR | Status: DC | PRN
  Administered 2011-08-26: 4 mg via INTRAVENOUS

## 2011-08-26 MED ORDER — LIDOCAINE HCL (CARDIAC) 20 MG/ML IV SOLN
INTRAVENOUS | Status: DC | PRN
  Administered 2011-08-26: 60 mg via INTRAVENOUS

## 2011-08-26 MED ORDER — ARTIFICIAL TEARS OP OINT
TOPICAL_OINTMENT | OPHTHALMIC | Status: DC | PRN
  Administered 2011-08-26: 1 via OPHTHALMIC

## 2011-08-26 MED ORDER — FENTANYL CITRATE 0.05 MG/ML IJ SOLN
INTRAMUSCULAR | Status: DC | PRN
  Administered 2011-08-26 (×5): 50 ug via INTRAVENOUS

## 2011-08-26 MED ORDER — STERILE WATER FOR IRRIGATION IR SOLN
Status: DC | PRN
  Administered 2011-08-26: 1000 mL via INTRAVESICAL

## 2011-08-26 MED ORDER — INDIGOTINDISULFONATE SODIUM 8 MG/ML IJ SOLN
INTRAMUSCULAR | Status: DC | PRN
  Administered 2011-08-26: 5 mL via INTRAVENOUS

## 2011-08-26 MED ORDER — DEXTROSE IN LACTATED RINGERS 5 % IV SOLN: INTRAVENOUS | Status: DC

## 2011-08-26 MED ORDER — KETOROLAC TROMETHAMINE 30 MG/ML IJ SOLN
INTRAMUSCULAR | Status: AC
  Filled 2011-08-26: qty 1

## 2011-08-26 MED ORDER — CEFAZOLIN SODIUM 1-5 GM-% IV SOLN
1.0000 g | Freq: Once | INTRAVENOUS | Status: AC
  Administered 2011-08-26: 1 g via INTRAVENOUS

## 2011-08-26 MED ORDER — MUPIROCIN 2 % EX OINT: TOPICAL_OINTMENT | Freq: Two times a day (BID) | CUTANEOUS | Status: DC

## 2011-08-26 MED ORDER — PROPOFOL 10 MG/ML IV EMUL
INTRAVENOUS | Status: DC | PRN
  Administered 2011-08-26: 150 mg via INTRAVENOUS
  Administered 2011-08-26: 20 mg via INTRAVENOUS

## 2011-08-26 MED ORDER — MIDAZOLAM HCL 5 MG/5ML IJ SOLN
INTRAMUSCULAR | Status: DC | PRN
  Administered 2011-08-26: 2 mg via INTRAVENOUS

## 2011-08-26 MED ORDER — FENTANYL CITRATE 0.05 MG/ML IJ SOLN
25.0000 ug | INTRAMUSCULAR | Status: DC | PRN
  Administered 2011-08-26 (×3): 50 ug via INTRAVENOUS

## 2011-08-26 MED ORDER — BACITRACIN ZINC 500 UNIT/GM EX OINT
TOPICAL_OINTMENT | CUTANEOUS | Status: AC
  Filled 2011-08-26: qty 30

## 2011-08-26 MED ORDER — INDIGOTINDISULFONATE SODIUM 8 MG/ML IJ SOLN
INTRAMUSCULAR | Status: AC
  Filled 2011-08-26: qty 5

## 2011-08-26 MED ORDER — GLYCOPYRROLATE 0.2 MG/ML IJ SOLN
INTRAMUSCULAR | Status: DC | PRN
  Administered 2011-08-26: .8 mg via INTRAVENOUS
  Administered 2011-08-26: 0.1 mg via INTRAVENOUS

## 2011-08-26 MED ORDER — MEPERIDINE HCL 25 MG/ML IJ SOLN: 6.2500 mg | INTRAMUSCULAR | Status: DC | PRN

## 2011-08-26 MED ORDER — LACTATED RINGERS IR SOLN
Status: DC | PRN
  Administered 2011-08-26: 3000 mL

## 2011-08-26 MED ORDER — ONDANSETRON HCL 4 MG/2ML IJ SOLN: 4.0000 mg | Freq: Once | INTRAMUSCULAR | Status: DC | PRN

## 2011-08-26 MED ORDER — ONDANSETRON HCL 4 MG/2ML IJ SOLN
INTRAMUSCULAR | Status: DC | PRN
  Administered 2011-08-26: 4 mg via INTRAVENOUS

## 2011-08-26 MED ORDER — OXYCODONE-ACETAMINOPHEN 5-325 MG PO TABS
1.0000 | ORAL_TABLET | Freq: Once | ORAL | Status: AC
  Administered 2011-08-26: 1 via ORAL
  Filled 2011-08-26: qty 1

## 2011-08-26 MED ORDER — ACETAMINOPHEN 10 MG/ML IV SOLN
INTRAVENOUS | Status: DC | PRN
  Administered 2011-08-26: 1000 mg via INTRAVENOUS

## 2011-08-26 MED ORDER — OXYCODONE-ACETAMINOPHEN 5-325 MG PO TABS: 1.0000 | ORAL_TABLET | ORAL | Status: AC | PRN

## 2011-08-26 MED ORDER — ROPIVACAINE HCL 5 MG/ML IJ SOLN
INTRAMUSCULAR | Status: DC | PRN
  Administered 2011-08-26: 80 mL

## 2011-08-26 SURGICAL SUPPLY — 69 items
ADH SKN CLS APL DERMABOND .7 (GAUZE/BANDAGES/DRESSINGS) ×3
APL SKNCLS STERI-STRIP NONHPOA (GAUZE/BANDAGES/DRESSINGS) ×3
BAG URINE DRAINAGE (UROLOGICAL SUPPLIES) ×4 IMPLANT
BARRIER ADHS 3X4 INTERCEED (GAUZE/BANDAGES/DRESSINGS) IMPLANT
BENZOIN TINCTURE PRP APPL 2/3 (GAUZE/BANDAGES/DRESSINGS) ×4 IMPLANT
BLADE LAPAROSCOPIC MORCELL KIT (BLADE) IMPLANT
BLADELESS LONG 8MM (BLADE) ×4 IMPLANT
BRR ADH 4X3 ABS CNTRL BYND (GAUZE/BANDAGES/DRESSINGS)
CABLE HIGH FREQUENCY MONO STRZ (ELECTRODE) ×4 IMPLANT
CATH FOLEY 3WAY  5CC 16FR (CATHETERS) ×1
CATH FOLEY 3WAY 5CC 16FR (CATHETERS) ×3 IMPLANT
CHLORAPREP W/TINT 26ML (MISCELLANEOUS) ×4 IMPLANT
CLOTH BEACON ORANGE TIMEOUT ST (SAFETY) ×4 IMPLANT
CONT PATH 16OZ SNAP LID 3702 (MISCELLANEOUS) ×4 IMPLANT
COVER MAYO STAND STRL (DRAPES) ×4 IMPLANT
COVER TABLE BACK 60X90 (DRAPES) ×8 IMPLANT
COVER TIP SHEARS 8 DVNC (MISCELLANEOUS) ×3 IMPLANT
COVER TIP SHEARS 8MM DA VINCI (MISCELLANEOUS) ×1
DECANTER SPIKE VIAL GLASS SM (MISCELLANEOUS) ×4 IMPLANT
DERMABOND ADVANCED (GAUZE/BANDAGES/DRESSINGS) ×1
DERMABOND ADVANCED .7 DNX12 (GAUZE/BANDAGES/DRESSINGS) ×3 IMPLANT
DRAPE HUG U DISPOSABLE (DRAPE) ×4 IMPLANT
DRAPE LG THREE QUARTER DISP (DRAPES) ×8 IMPLANT
DRAPE MONITOR DA VINCI (DRAPE) IMPLANT
DRAPE WARM FLUID 44X44 (DRAPE) ×4 IMPLANT
ELECT REM PT RETURN 9FT ADLT (ELECTROSURGICAL) ×4
ELECTRODE REM PT RTRN 9FT ADLT (ELECTROSURGICAL) ×3 IMPLANT
EVACUATOR SMOKE 8.L (FILTER) ×4 IMPLANT
GAUZE VASELINE 3X9 (GAUZE/BANDAGES/DRESSINGS) IMPLANT
GLOVE BIO SURGEON STRL SZ 6.5 (GLOVE) ×12 IMPLANT
GLOVE ECLIPSE 6.5 STRL STRAW (GLOVE) ×12 IMPLANT
GOWN STRL REIN XL XLG (GOWN DISPOSABLE) ×24 IMPLANT
KIT ACCESSORY DA VINCI DISP (KITS) ×1
KIT ACCESSORY DVNC DISP (KITS) ×3 IMPLANT
KIT DISP ACCESSORY 4 ARM (KITS) IMPLANT
MANIPULATOR UTERINE 4.5 ZUMI (MISCELLANEOUS) IMPLANT
NDL INSUFFLATION 14GA 120MM (NEEDLE) ×3 IMPLANT
NDL SPNL 18GX3.5 QUINCKE PK (NEEDLE) IMPLANT
NEEDLE INSUFFLATION 14GA 120MM (NEEDLE) ×4 IMPLANT
NEEDLE SPNL 18GX3.5 QUINCKE PK (NEEDLE) IMPLANT
NS IRRIG 1000ML POUR BTL (IV SOLUTION) ×12 IMPLANT
OCCLUDER COLPOPNEUMO (BALLOONS) ×8 IMPLANT
PACK LAVH (CUSTOM PROCEDURE TRAY) ×4 IMPLANT
PAD PREP 24X48 CUFFED NSTRL (MISCELLANEOUS) ×8 IMPLANT
PLUG CATH AND CAP STER (CATHETERS) ×4 IMPLANT
POSITIONER SURGICAL ARM (MISCELLANEOUS) ×8 IMPLANT
SET CYSTO W/LG BORE CLAMP LF (SET/KITS/TRAYS/PACK) IMPLANT
SET IRRIG TUBING LAPAROSCOPIC (IRRIGATION / IRRIGATOR) ×4 IMPLANT
SOLUTION ELECTROLUBE (MISCELLANEOUS) ×4 IMPLANT
SPONGE LAP 18X18 X RAY DECT (DISPOSABLE) IMPLANT
STRIP CLOSURE SKIN 1/2X4 (GAUZE/BANDAGES/DRESSINGS) ×4 IMPLANT
SUT VIC AB 0 CT1 27 (SUTURE)
SUT VIC AB 0 CT1 27XBRD ANTBC (SUTURE) IMPLANT
SUT VIC AB 2-0 CT2 27 (SUTURE) IMPLANT
SUT VICRYL 0 UR6 27IN ABS (SUTURE) ×8 IMPLANT
SUT VICRYL RAPIDE 4/0 PS 2 (SUTURE) ×8 IMPLANT
SYR 50ML LL SCALE MARK (SYRINGE) ×4 IMPLANT
SYSTEM CONVERTIBLE TROCAR (TROCAR) IMPLANT
TIP UTERINE 5.1X6CM LAV DISP (MISCELLANEOUS) IMPLANT
TIP UTERINE 6.7X10CM GRN DISP (MISCELLANEOUS) IMPLANT
TIP UTERINE 6.7X6CM WHT DISP (MISCELLANEOUS) IMPLANT
TIP UTERINE 6.7X8CM BLUE DISP (MISCELLANEOUS) IMPLANT
TOWEL OR 17X24 6PK STRL BLUE (TOWEL DISPOSABLE) ×8 IMPLANT
TROCAR DISP BLADELESS 8 DVNC (TROCAR) ×3 IMPLANT
TROCAR DISP BLADELESS 8MM (TROCAR) ×1
TROCAR XCEL 12X100 BLDLESS (ENDOMECHANICALS) IMPLANT
TROCAR Z-THREAD 12X150 (TROCAR) ×4 IMPLANT
TROCAR Z-THREAD BLADED 12X100M (TROCAR) IMPLANT
TUBING FILTER THERMOFLATOR (ELECTROSURGICAL) ×4 IMPLANT

## 2011-08-26 NOTE — H&P (Signed)
Katherine Harris is an 51 y.o. female. She has very heavy period for 2-3 years, fairly anemic. Hemoglobin nadired at 7 in 12/12. Now on iron her Hbg is improved. When on her period she has to use pads and tampons at the same time.  Pertinent Gynecological History: Menses: flow is excessive with use of 8 pads or tampons on heaviest days Bleeding: dysfunctional uterine bleeding Contraception: abstinence DES exposure: denies Blood transfusions: none Sexually transmitted diseases: past history: 1980s chlamydia Previous GYN Procedures: none  Last mammogram: normal Date: 2012 Last pap: normal Date: 2012 OB History: G3, P2A1   Menstrual History: Menarche age: 38 No LMP recorded.    Past Medical History  Diagnosis Date  . Breast cyst   . Fibroids   . HSV infection     TYPE 2  . Obesity   . HSV (herpes simplex virus) infection   . Fibroids   . Heavy menstrual bleeding   . Anemia     recently taking otc iron supplement per pt.  . Reflux     very mild per pt. - no meds  . Arthritis     right foot pain - otc med    Past Surgical History  Procedure Date  . Cesarean section 07/22/1990  . Upper gastrointestinal endoscopy   . Wisdom tooth extraction   . Svd 1997    x 1  . Breast excisional biopsy     bilateral  .  esophageal  stretching 09/2010  . Missed abortion     Family History  Problem Relation Age of Onset  . Hypertension Mother   . Liver cancer Mother   . Cancer Mother 22    Liver Cancer  . Leukemia Father   . Stomach cancer Maternal Aunt   . Cancer Maternal Aunt 38    Breast Cancer one breast  . Breast cancer Maternal Aunt   . Colon cancer Neg Hx   . Cancer Paternal Grandmother 4    Breast Cancer  . Cancer Cousin 50    Brain Cancer    Social History:  reports that she has never smoked. She has never used smokeless tobacco. She reports that she does not drink alcohol or use illicit drugs.  Allergies: No Known Allergies  Prescriptions prior to admission    Medication Sig Dispense Refill  . aspirin 325 MG tablet Take 325 mg by mouth 2 (two) times a week.       . ferrous sulfate 325 (65 FE) MG tablet Take 325 mg by mouth daily with breakfast.        . Multiple Vitamins-Minerals (MULTIVITAMINS THER. W/MINERALS) TABS Take 1 tablet by mouth 2 (two) times a week.          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 Physical Exam Heart-rrr Lungs- CTAB Abd-obese, benign  No results found for this or any previous visit (from the past 24 hour(s)).  No results found.  Assessment/Plan: Symptomatic fibroids and menorrhagia. We have discussed options for treatment. She would like to have a RATH/BSO. She understands that she may want ERT post operatively. She understands there are risks of surgery including infection, bleeding, damage to bowel, bladder, ureters. She also understands that steep Trendelenburg position is required for this surgery.  Townsend Cudworth C. 08/26/2011, 12:09 PM

## 2011-08-26 NOTE — Anesthesia Procedure Notes (Signed)
Procedure Name: Intubation Date/Time: 08/26/2011 1:21 PM Performed by: Karleen Dolphin Pre-anesthesia Checklist: Patient identified, Patient being monitored, Emergency Drugs available, Timeout performed and Suction available Patient Re-evaluated:Patient Re-evaluated prior to inductionOxygen Delivery Method: Circle System Utilized Preoxygenation: Pre-oxygenation with 100% oxygen Intubation Type: IV induction Ventilation: Mask ventilation without difficulty Laryngoscope Size: 3 Grade View: Grade III Tube size: 7.0 mm Number of attempts: 1 Airway Equipment and Method: stylet and video-laryngoscopy Placement Confirmation: ETT inserted through vocal cords under direct vision,  breath sounds checked- equal and bilateral and positive ETCO2 Secured at: 21 cm Tube secured with: Tape Dental Injury: Teeth and Oropharynx as per pre-operative assessment

## 2011-08-26 NOTE — Progress Notes (Signed)
DC IV DC instructions reviewed with pt and family. Pt and family states complete understanding. Prescription given to pt. Pt stable. With no complaints. Pt taken out by wheelchair to main entrance.

## 2011-08-26 NOTE — Anesthesia Preprocedure Evaluation (Signed)
Anesthesia Evaluation  Patient identified by MRN, date of birth, ID band Patient awake    Reviewed: Allergy & Precautions, H&P , NPO status , Patient's Chart, lab work & pertinent test results  Airway Mallampati: II TM Distance: >3 FB Neck ROM: full    Dental No notable dental hx. (+) Teeth Intact   Pulmonary neg pulmonary ROS,    Pulmonary exam normal       Cardiovascular neg cardio ROS     Neuro/Psych Negative Neurological ROS  Negative Psych ROS   GI/Hepatic negative GI ROS, Neg liver ROS,   Endo/Other  Morbid obesity  Renal/GU negative Renal ROS  Genitourinary negative   Musculoskeletal negative musculoskeletal ROS (+)   Abdominal (+) obese,   Peds negative pediatric ROS (+)  Hematology negative hematology ROS (+)   Anesthesia Other Findings   Reproductive/Obstetrics negative OB ROS                           Anesthesia Physical Anesthesia Plan  ASA: III  Anesthesia Plan: General   Post-op Pain Management:    Induction: Intravenous  Airway Management Planned: Oral ETT  Additional Equipment:   Intra-op Plan:   Post-operative Plan: Extubation in OR  Informed Consent: I have reviewed the patients History and Physical, chart, labs and discussed the procedure including the risks, benefits and alternatives for the proposed anesthesia with the patient or authorized representative who has indicated his/her understanding and acceptance.   Dental Advisory Given  Plan Discussed with: CRNA  Anesthesia Plan Comments:        Anesthesia Quick Evaluation  

## 2011-08-26 NOTE — Transfer of Care (Signed)
Immediate Anesthesia Transfer of Care Note  Patient: Katherine Harris  Procedure(s) Performed:  ROBOTIC ASSISTED TOTAL HYSTERECTOMY; SALPINGO OOPHERECTOMY; CYSTOSCOPY  Patient Location: PACU  Anesthesia Type: General  Level of Consciousness: oriented and sedated  Airway & Oxygen Therapy: Patient Spontanous Breathing and Patient connected to nasal cannula oxygen  Post-op Assessment: Report given to PACU RN and Post -op Vital signs reviewed and stable  Post vital signs: stable  Complications: No apparent anesthesia complications

## 2011-08-26 NOTE — Anesthesia Postprocedure Evaluation (Signed)
Anesthesia Post Note  Patient: Katherine Harris  Procedure(s) Performed:  ROBOTIC ASSISTED TOTAL HYSTERECTOMY; SALPINGO OOPHERECTOMY; CYSTOSCOPY  Anesthesia type: GA  Patient location: PACU  Post pain: Pain level controlled  Post assessment: Post-op Vital signs reviewed  Last Vitals:  Filed Vitals:   08/26/11 1600  BP: 117/60  Pulse: 77  Temp:   Resp: 16    Post vital signs: Reviewed  Level of consciousness: sedated  Complications: No apparent anesthesia complications

## 2011-08-26 NOTE — Op Note (Signed)
08/26/2011  3:14 PM  PATIENT:  Katherine Harris  51 y.o. female  PRE-OPERATIVE DIAGNOSIS:  Symptomatic Fibroids  POST-OPERATIVE DIAGNOSIS:  Symptomatic Fibroids  PROCEDURE:  Procedure(s): ROBOTIC ASSISTED TOTAL HYSTERECTOMY SALPINGO OOPHERECTOMY CYSTOSCOPY  SURGEON:  Nicholaus Bloom, MD  PHYSICIAN ASSISTANT:   ASSISTANTS: Scheryl Darter, MD   ANESTHESIA:   general  EBL:  Total I/O In: 1000 [I.V.:1000] Out: 225 [Urine:175; Blood:50]  BLOOD ADMINISTERED:none  DRAINS: none   LOCAL MEDICATIONS USED:  OTHER ropivicaine  SPECIMEN:  Source of Specimen:  uterus, tubes, ovaries  DISPOSITION OF SPECIMEN:  PATHOLOGY  COUNTS:  YES  TOURNIQUET:  * No tourniquets in log *  DICTATION: .Dragon Dictation  PLAN OF CARE: outpatient with extended recovery  PATIENT DISPOSITION:  PACU - hemodynamically stable.   Delay start of Pharmacological VTE agent (>24hrs) due to surgical blood loss or risk of bleeding:  No  The risks, benefits, and alternatives of surgery were explained, understood, accepted. All questions were answered and consents were signed. I specifically told her that robotic approach has an increased incidence of vaginal cuff dehiscence She also understands the steep Trendelenburg position will be used during the case.I also counseled her about normal risks associated with the surgery including, but not limited to, infection, bleeding, damage to bowel, bladder, ureters. In the operating room she was placed in the dorsal lithotomy position and general anesthesia was applied without complication. Her abdomen and vagina were prepped and draped in the usual sterile fashion. After extensive careful tucking measures were performed, a Rumi uterine manipulator was placed. Please note that the uterus sounded to 10 cm. The cervix was dilated to accommodate a Rumi retractor. It was sewn into place at the cervix. I did a paravaginal cuff block with the dilute ropivacaine (10 cc). I placed a Foley  catheter, and it drained clear urine throughout the case. Please note that prior to starting the case we did a timeout and I did a bimanual exam which revealed a 12 week size mobile uterus and nonenlarged adnexa. Gloves were changed and I turned my attention to the abdomen. A vertical supraumbilical incision was made and a varies needle was placed intraperitoneally. Low-flow CO2 was used to insufflate the abdomen to approximately 5-1/2 L. Patient abdominal pressure was always less than 15. Laparoscopy confirmed correct placement. She was placed in steep Trendelenburg position. The pelvis was inspected the uterus was enlarged consistent with a fibroid seen on ultrasound. Her adnexa appeared normal. There was a omental adhesion to the anterior abdominal wall. Her ovaries appeared normal. We then placed a 12 mm system port in the right lower quadrant and two 8 mm ports approximately 12 cm lateral to the umbilicus. I placed the robotic third arm (8 mm) in the left lower quadrant. Prior to making the incisions each incision site was injected with 10 mL of dilute ropivacaine. The ports were placed in the robot was docked. I then proceeded to the robotic console. The ureters position were were noted bilaterally throughout the case. The infundibulopelvic ligaments were identified bilaterally and cauterized using the PK device. The round ligaments were also identified and ligated with the PK device. A bladder flap was created anteriorly, taking care to avoid damage to the bladder. An anterior colpotomy was made. This incision was carried around circumferentially. Please note that prior to the colpotomy incision I ligated the uterine vessels bilaterally. The uterus, tubes, and ovaries were removed through the vagina. The vaginal cuff was closed with a total of four  0- Vicryl figure-of-eight sutures. Excellent hemostasis was noted at all pedicles and the vaginal cuff. Both ureters were noted to be functioning normally and of  normal caliber. 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 The robot was undocked.We then closed the 12 mm system port site with a Endoloop closure. The remainder of the ports were removed.The fascia at the supraumbilical camera port incision was closed with 0 Vicryl and the subcutaneous tissue at all sites was closed with 4-0 Vicryl suture. The instrument sponge and needle counts were correct. She tolerated the procedure well. She was extubated and taken to recovery in stable condition.

## 2011-08-28 ENCOUNTER — Encounter (HOSPITAL_COMMUNITY): Payer: Self-pay | Admitting: Obstetrics & Gynecology

## 2011-08-28 LAB — MRSA CULTURE

## 2011-09-11 ENCOUNTER — Telehealth: Payer: Self-pay | Admitting: Obstetrics & Gynecology

## 2011-09-11 NOTE — Telephone Encounter (Signed)
See phone call note

## 2011-09-26 ENCOUNTER — Ambulatory Visit (INDEPENDENT_AMBULATORY_CARE_PROVIDER_SITE_OTHER): Payer: BC Managed Care – PPO | Admitting: Obstetrics & Gynecology

## 2011-09-26 ENCOUNTER — Encounter: Payer: Self-pay | Admitting: Obstetrics & Gynecology

## 2011-09-26 VITALS — BP 128/80 | HR 79 | Ht 63.0 in | Wt 198.0 lb

## 2011-09-26 DIAGNOSIS — Z09 Encounter for follow-up examination after completed treatment for conditions other than malignant neoplasm: Secondary | ICD-10-CM

## 2011-09-26 NOTE — Progress Notes (Signed)
  Subjective:    Patient ID: Katherine Harris, female    DOB: 07/13/1961, 51 y.o.   MRN: [id number]  HPI  She is now about 6 weeks status post RATH/BSO/cystoscopy. She has no complaints and wants to return to work. She has not had sex yet (doesn't have a boyfriend now). Denies any problems except some mild RLQ discomfort for which she takes IBU.  Review of Systems pathol showed fibroids    Objective:   Physical Exam  Well healed abd incisions. Well healed vaginal cuff Normal bimanual exam      Assessment & Plan:  Post op doing well RTC 1 year/prn sooner

## 2012-05-15 ENCOUNTER — Other Ambulatory Visit: Payer: Self-pay | Admitting: Obstetrics & Gynecology

## 2012-05-15 DIAGNOSIS — Z1231 Encounter for screening mammogram for malignant neoplasm of breast: Secondary | ICD-10-CM

## 2012-06-01 ENCOUNTER — Ambulatory Visit (HOSPITAL_COMMUNITY): Payer: BC Managed Care – PPO

## 2012-06-24 ENCOUNTER — Ambulatory Visit (HOSPITAL_COMMUNITY): Payer: BC Managed Care – PPO

## 2012-07-13 ENCOUNTER — Ambulatory Visit (HOSPITAL_COMMUNITY)
Admission: RE | Admit: 2012-07-13 | Discharge: 2012-07-13 | Disposition: A | Discharge status: Home / Self Care | Payer: BC Managed Care – PPO | Source: Ambulatory Visit | Attending: Obstetrics & Gynecology | Admitting: Obstetrics & Gynecology

## 2012-07-13 DIAGNOSIS — Z1231 Encounter for screening mammogram for malignant neoplasm of breast: Secondary | ICD-10-CM

## 2012-08-09 IMAGING — US US TRANSVAGINAL NON-OB
1 series · 14 of 25 positions shown · non-contrast
Comparison: Ultrasound of the pelvis of 11/01/2017

CLINICAL DATA: Left sided abdominal and pelvic pain

TRANSABDOMINAL AND TRANSVAGINAL ULTRASOUND OF PELVIS
TECHNIQUE: Both transabdominal and transvaginal ultrasound
examinations of the pelvis were performed including evaluation of
the uterus, ovaries, adnexal regions, and pelvic cul-de-sac.

[Series 1: us transvaginal non-ob · 0.28mm/px · 14 of 70 slices shown]
[im 1/70]
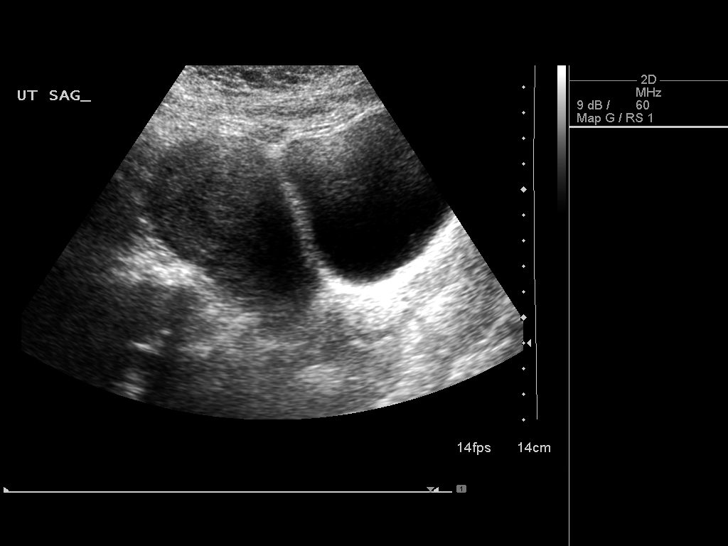
[im 6/70]
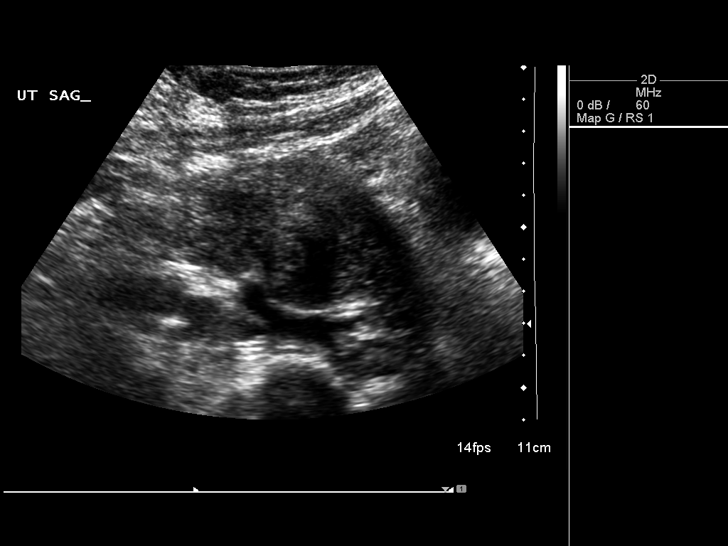
[im 12/70]
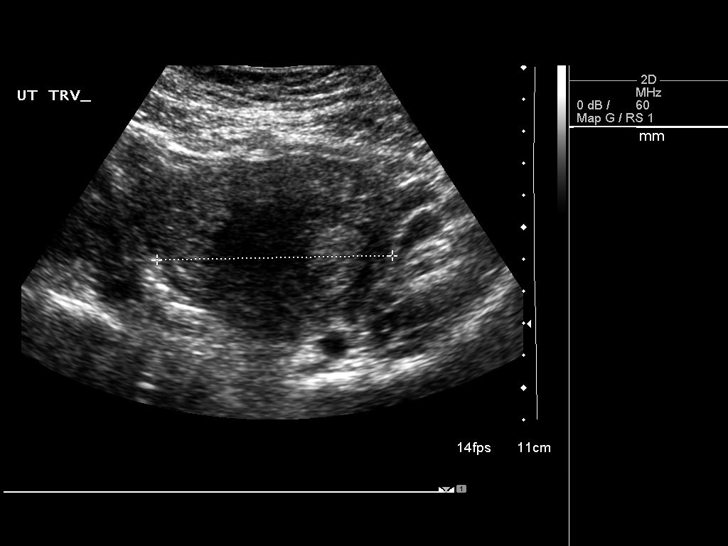
[im 18/70]
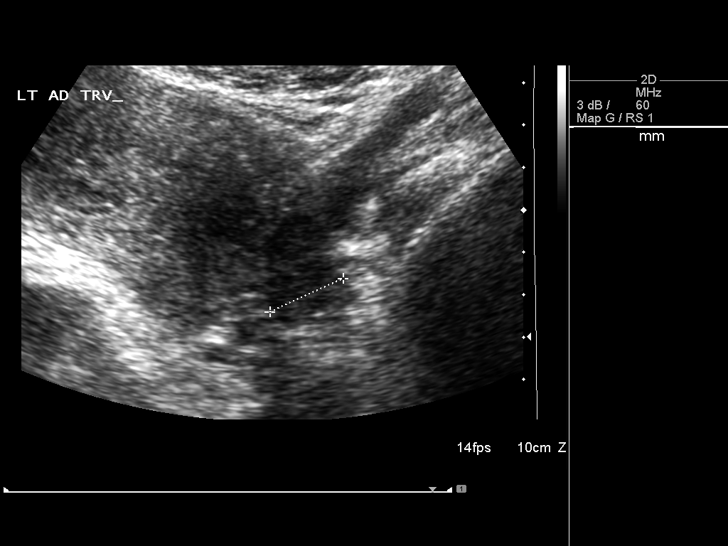
[im 24/70]
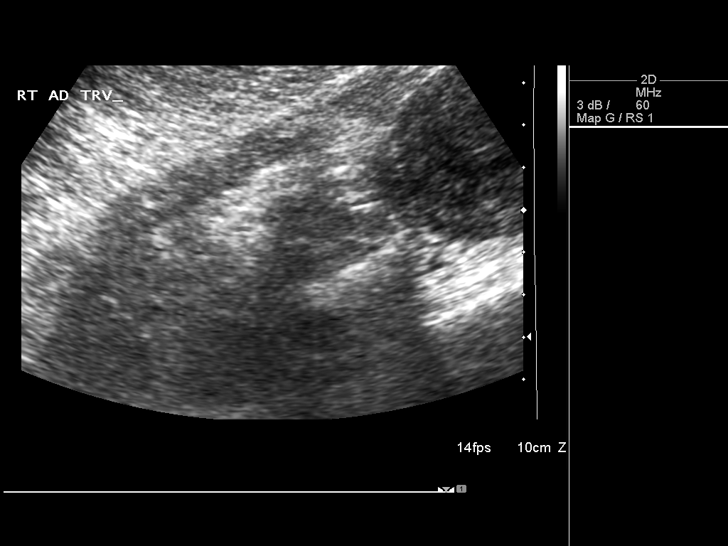
[im 26/70]
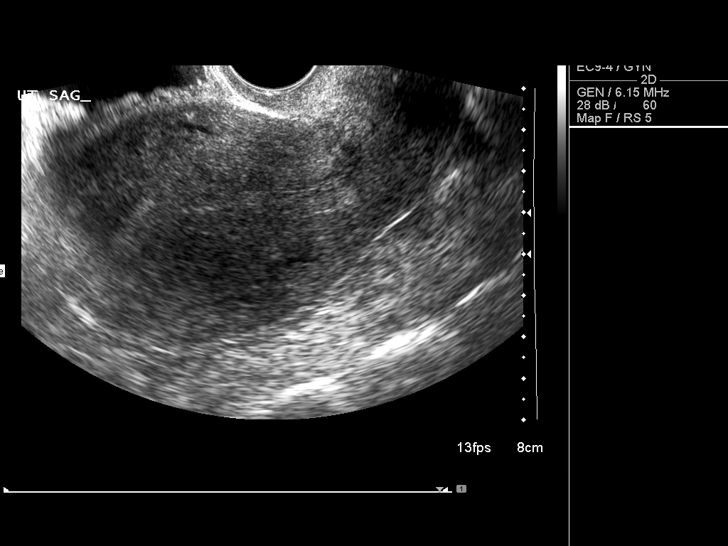
[im 32/70]
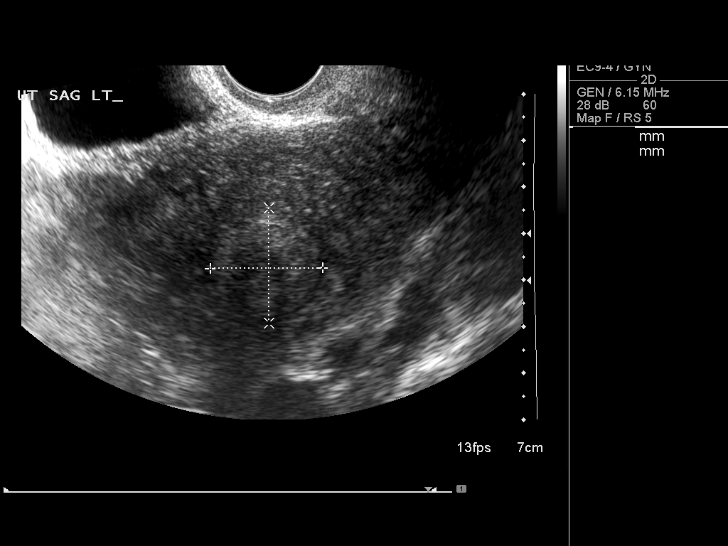
[im 38/70]
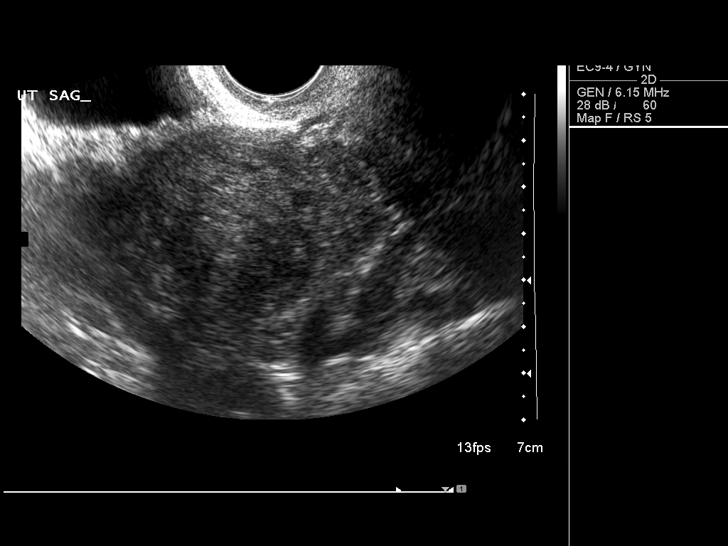
[im 44/70]
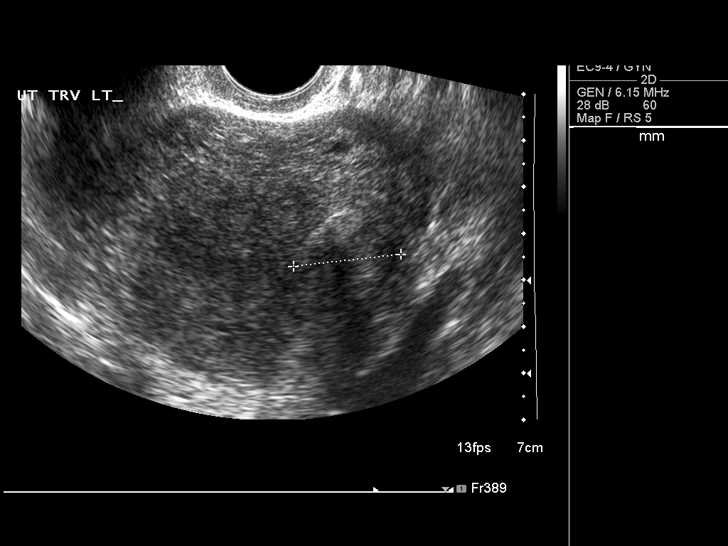
[im 47/70]
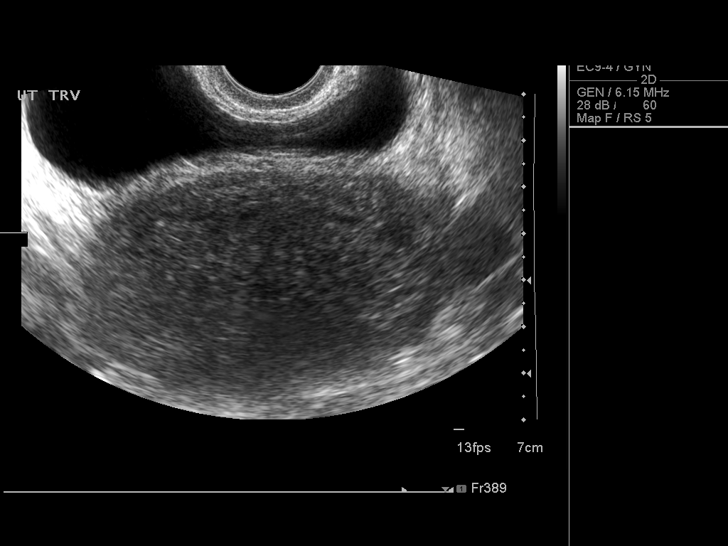
[im 52/70]
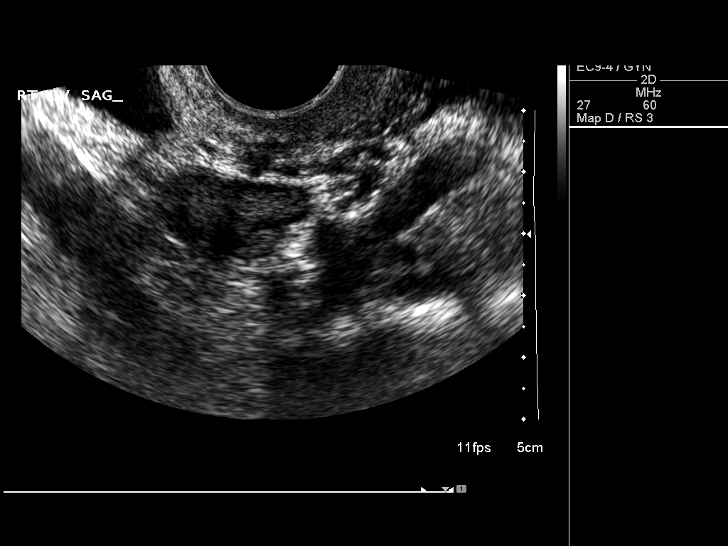
[im 58/70]
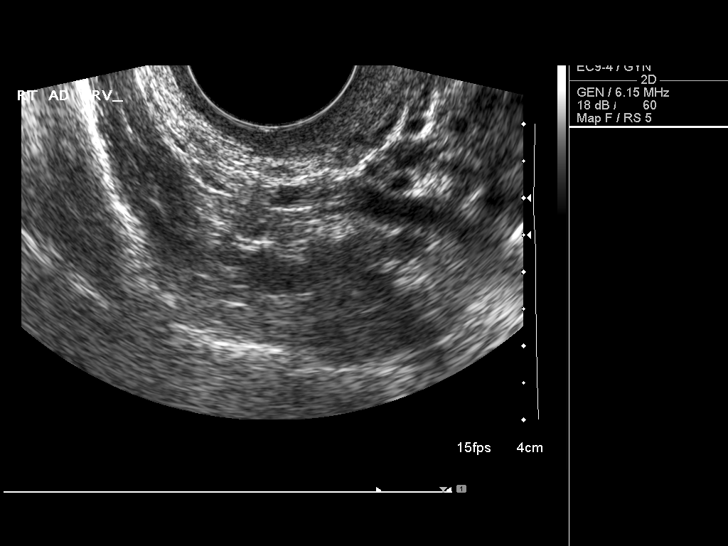
[im 64/70]
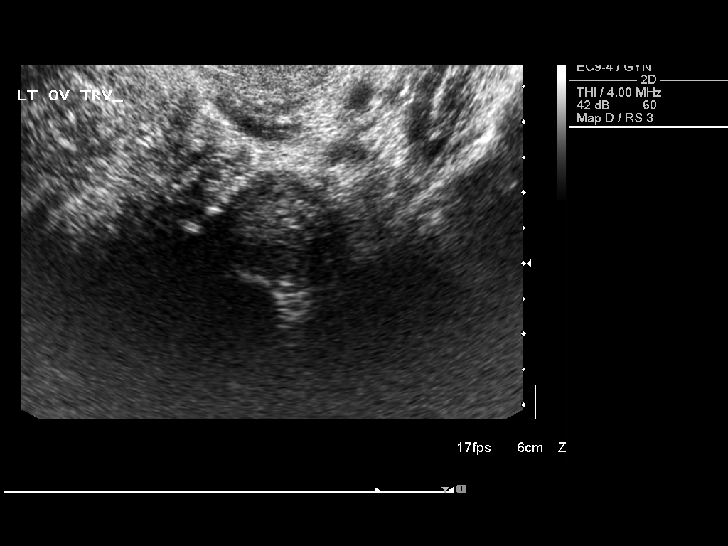
[im 70/70]
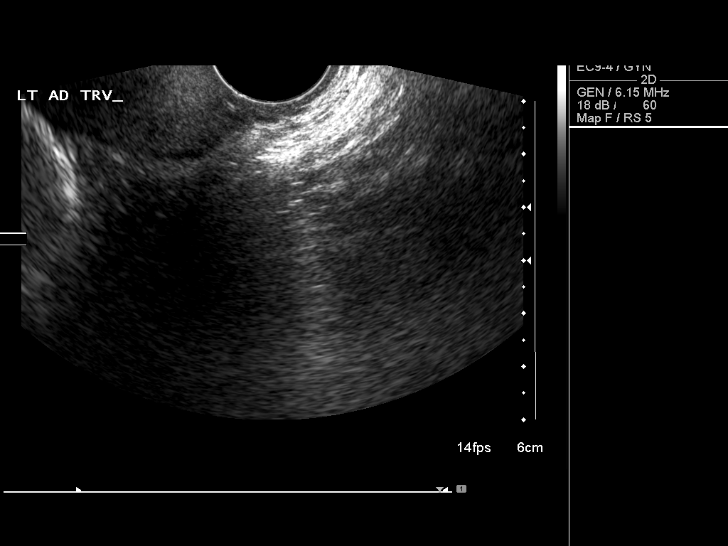

[14 of 25 positions shown; findings below may reference images not displayed]

FINDINGS: Uterus: The uterus measures 12.3 cm sagittally with a depth of
cm and width of 7.3 cm.  A fibroid is present along the left
lateral uterine body of 2.4 x 2.5 x 2.3 cm, measuring 1.4 cm in
diameter in 1552.  No other definite measurable fibroid is seen.

Endometrium:The endometrium is within normal limits measuring
mm in thickness.

Right Ovary :The right ovary is normal measuring 3.1 x 1.9 x
cm.

Left Ovary :The left ovary is normal measuring 2.6 x 1.7 x 1.7 cm.

Other Findings:  No free fluid is seen.
IMPRESSION: 1.  Increase in size of left uterine fibroid of 2.5 centimeters in
maximum diameter compared to 1.4 cm in 1552.
2.  No abnormality of the endometrium.
3.  The ovaries are unremarkable.

## 2013-06-25 ENCOUNTER — Other Ambulatory Visit: Payer: Self-pay | Admitting: Obstetrics & Gynecology

## 2013-06-25 DIAGNOSIS — Z1231 Encounter for screening mammogram for malignant neoplasm of breast: Secondary | ICD-10-CM

## 2013-07-14 ENCOUNTER — Ambulatory Visit (HOSPITAL_COMMUNITY)
Admission: RE | Admit: 2013-07-14 | Discharge: 2013-07-14 | Disposition: A | Discharge status: Home / Self Care | Payer: BC Managed Care – PPO | Source: Ambulatory Visit | Attending: Obstetrics & Gynecology | Admitting: Obstetrics & Gynecology

## 2013-07-14 DIAGNOSIS — Z1231 Encounter for screening mammogram for malignant neoplasm of breast: Secondary | ICD-10-CM | POA: Insufficient documentation

## 2013-07-27 ENCOUNTER — Other Ambulatory Visit: Payer: Self-pay | Admitting: Obstetrics & Gynecology

## 2013-07-27 DIAGNOSIS — R928 Other abnormal and inconclusive findings on diagnostic imaging of breast: Secondary | ICD-10-CM

## 2013-08-04 ENCOUNTER — Other Ambulatory Visit: Payer: BC Managed Care – PPO

## 2013-08-06 ENCOUNTER — Ambulatory Visit
Admission: RE | Admit: 2013-08-06 | Discharge: 2013-08-06 | Disposition: A | Discharge status: Home / Self Care | Payer: BC Managed Care – PPO | Source: Ambulatory Visit | Attending: Obstetrics & Gynecology | Admitting: Obstetrics & Gynecology

## 2013-08-06 ENCOUNTER — Other Ambulatory Visit: Payer: BC Managed Care – PPO

## 2013-08-06 DIAGNOSIS — R928 Other abnormal and inconclusive findings on diagnostic imaging of breast: Secondary | ICD-10-CM

## 2013-08-20 ENCOUNTER — Encounter (INDEPENDENT_AMBULATORY_CARE_PROVIDER_SITE_OTHER): Payer: Self-pay

## 2013-08-20 ENCOUNTER — Ambulatory Visit (INDEPENDENT_AMBULATORY_CARE_PROVIDER_SITE_OTHER): Payer: BC Managed Care – PPO | Admitting: General Surgery

## 2013-08-20 ENCOUNTER — Encounter (INDEPENDENT_AMBULATORY_CARE_PROVIDER_SITE_OTHER): Payer: Self-pay | Admitting: General Surgery

## 2013-08-20 ENCOUNTER — Telehealth (INDEPENDENT_AMBULATORY_CARE_PROVIDER_SITE_OTHER): Payer: Self-pay | Admitting: General Surgery

## 2013-08-20 VITALS — BP 116/82 | HR 72 | Temp 98.2°F | Resp 16 | Ht 62.0 in | Wt 226.8 lb

## 2013-08-20 DIAGNOSIS — N6310 Unspecified lump in the right breast, unspecified quadrant: Secondary | ICD-10-CM

## 2013-08-20 DIAGNOSIS — N63 Unspecified lump in unspecified breast: Secondary | ICD-10-CM

## 2013-08-20 NOTE — Telephone Encounter (Signed)
Pt made aware of [facility] financial obligation will call back to schedule  °

## 2013-08-20 NOTE — Progress Notes (Signed)
Subjective:   right breast lump  Patient ID: Katherine Harris, female   DOB: [birth year], 53 y.o.   MRN: [id number]  HPI Patient is a very pleasant 53 year old female referred by Dr. Autumn Patty for a right breast mass. The patient has a history of an open right breast biopsy through a circumareolar incision some years ago. She does not exactly recall how long or the specific diagnosis although it was benign. The patient had not really noticed any change in her breast exam or discrete lump. She recently presented for her screening right mammogram. This revealed 2 possible breast masses on the right side. Ultrasound was then performed showing a benign appearing cyst medially but a circumscribed subareolar mass associated with a dilated duct measuring 15 x 9 x 5 mm at the 12:00 position subareolar. This was felt very likely represent a papilloma and core biopsy was not performed and she is referred for excision. She has had no nipple discharge or breast pain and again had not noticed any palpable masses. She has a remote family history of breast cancer in an aunt only. She has postmenopausal and status post TAH BSO.  Past Medical History  Diagnosis Date  . Breast cyst   . Fibroids   . HSV infection     TYPE 2  . Obesity   . HSV (herpes simplex virus) infection   . Fibroids   . Heavy menstrual bleeding   . Anemia     recently taking otc iron supplement per pt.  . Reflux     very mild per pt. - no meds  . Arthritis     right foot pain - otc med   .pshj Current Outpatient Prescriptions  Medication Sig Dispense Refill  . BLACK COHOSH PO Take by mouth.      . Multiple Vitamins-Minerals (MULTIVITAMINS THER. W/MINERALS) TABS Take 1 tablet by mouth 2 (two) times a week.        . naproxen (NAPROSYN) 250 MG tablet Take by mouth 2 (two) times daily with a meal.      . VITAMIN E PO Take by mouth.       No current facility-administered medications for this visit.   No Known Allergies History  Substance  Use Topics  . Smoking status: Never Smoker   . Smokeless tobacco: Never Used  . Alcohol Use: No      Review of Systems  Constitutional: Negative.   Respiratory: Negative.   Cardiovascular: Negative.        Objective:   Physical Exam BP 116/82  Pulse 72  Temp(Src) 98.2 F (36.8 C) (Oral)  Resp 16  Ht 5\' 2"  (1.575 m)  Wt 226 lb 12.8 oz (102.876 kg)  BMI 41.47 kg/m2  LMP 06/10/2011 General: Alert, Moderately obese American female, in no distress Skin: Warm and dry without rash or infection. HEENT: No palpable masses or thyromegaly. Sclera nonicteric. Pupils equal round and reactive. Oropharynx clear. Breasts: There is a well-healed superior circumareolar incision on the right. Beneath the right areola at the 12:00 position is a well-circumscribed firm freely movable mass measuring just under 1 cm. It is very superficial. I cannot feel any other abnormalities in either breast. Lymph nodes: No cervical, supraclavicular, or inguinal nodes palpable. Lungs: Breath sounds clear and equal without increased work of breathing Cardiovascular: Regular rate and rhythm without murmur. No JVD or edema.  Extremities: No edema or joint swelling or deformity. No chronic venous stasis changes. Neurologic: Alert and fully oriented. Gait normal.  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 It is easily palpable in the subareolar position on the right. I've recommended surgical excision to rule out a small chance of malignancy. We discussed the indications for the procedure and its nature as well as risks of anesthetic complications, bleeding, infection and possible diagnoses and recovery. She was given literature and all her questions were answered.     Plan:     Right breast lumpectomy under general anesthesia as an outpatient

## 2013-08-20 NOTE — Patient Instructions (Signed)
Breast Biopsy  A breast biopsy is a procedure where a sample of breast tissue is removed from your breast. The tissue is examined under a microscope to see if cancerous cells are present. A breast biopsy is done when there is:  · Any undiagnosed breast mass (tumor).  · Nipple abnormalities, dimpling, crusting, or ulcerations.  · Abnormal discharge from the nipple, especially blood.  · Redness, swelling, and pain of the breast.  · Calcium deposits (calcifications) or abnormalities seen on a mammogram, ultrasound result, or results of magnetic resonance imaging (MRI).  · Suspicious changes in the breast seen on your mammogram.  If the tumor is found to be cancerous (malignant), a breast biopsy can help to determine what the best treatment is for you. There are many different types of breast biopsies. Talk to your caregiver about your options and which type is best for you.  LET YOUR CAREGIVER KNOW ABOUT:  · Allergies to food or medicine.  · Medicines taken, including vitamins, herbs, eyedrops, over-the-counter medicines, and creams.  · Use of steroids (by mouth or creams).  · Previous problems with anesthetics or numbing medicines.  · History of bleeding problems or blood clots.  · Previous surgery.  · Other health problems, including diabetes and kidney problems.  · Any recent colds or infections.  · Possibility of pregnancy, if this applies.  RISKS AND COMPLICATIONS   · Bleeding.  · Infection.  · Allergy to medicines.  · Bruising and swelling of the breast.  · Alteration in the shape of the breast.  · Not finding the lump or abnormality.  · Needing more surgery.  BEFORE THE PROCEDURE  · Arrange for someone to drive you home after the procedure.  · Do not smoke for 2 weeks before the procedure. Stop smoking, if you smoke.  · Do not drink alcohol for 24 hours before procedure.  · Wear a good support bra to the procedure.  PROCEDURE   You may be given a medicine to numb the breast area (local anesthesia) or a medicine  to make you sleep (general anesthesia) during the procedure. The following are the different types of biopsies that can be performed.   · Fine-needle aspiration A thin needle is attached to a syringe and inserted into the breast lump. Fluid and cells are removed and then looked at under a microscope. If the breast lump cannot be felt, an ultrasound may be used to help locate the lump and place the needle in the correct area.    · Core needle biopsy A wide, hollow needle (core needle) is inserted into the breast lump 3 6 times to get tissue samples or cores. The samples are removed. The needle is usually placed in the correct area by using an ultrasound or X-ray.    · Stereotactic biopsy X-ray equipment and a computer are used to analyze X-ray pictures of the breast lump. The computer then finds exactly where the core needle needs to be inserted. Tissue samples are removed.    · Vacuum-assisted biopsy A small incision (less than ¼ inch) is made in your breast. A biopsy device that includes a hollow needle and vacuum is passed through the incision and into the breast tissue. The vacuum gently draws abnormal breast tissue into the needle to remove it. This type of biopsy removes a larger tissue sample than a regular core needle biopsy. No stitches are needed, and there is usually little scarring.  · Ultrasound-guided core needle biopsy A high frequency ultrasound helps guide   the core needle to the area of the mass or abnormality. An incision is made to insert the needle. Tissue samples are removed.  · Open biopsy A larger incision is made in the breast. Your caregiver will attempt to remove the whole breast lump or as much as possible.  AFTER THE PROCEDURE  · You will be taken to the recovery area. If you are doing well and have no problems, you will be allowed to go home.  · You may notice bruising on your breast. This is normal.  · Your caregiver may apply a pressure dressing on your breast for 24 48 hours. A  pressure dressing is a bandage that is wrapped tightly around the chest to stop fluid from collecting underneath tissues.  Document Released: 07/08/2005 Document Revised: 11/02/2012 Document Reviewed: 08/08/2011  [employer]® Patient Information ©2014 [employer].

## 2013-09-13 ENCOUNTER — Ambulatory Visit: Payer: BC Managed Care – PPO | Admitting: Family Medicine

## 2013-12-01 ENCOUNTER — Encounter (HOSPITAL_BASED_OUTPATIENT_CLINIC_OR_DEPARTMENT_OTHER): Payer: Self-pay | Admitting: *Deleted

## 2013-12-02 ENCOUNTER — Other Ambulatory Visit (INDEPENDENT_AMBULATORY_CARE_PROVIDER_SITE_OTHER): Payer: Self-pay | Admitting: General Surgery

## 2013-12-06 ENCOUNTER — Ambulatory Visit (HOSPITAL_BASED_OUTPATIENT_CLINIC_OR_DEPARTMENT_OTHER)
Admission: RE | Admit: 2013-12-06 | Discharge: 2013-12-06 | Disposition: A | Discharge status: Home / Self Care | Payer: BC Managed Care – PPO | Source: Ambulatory Visit | Attending: General Surgery | Admitting: General Surgery

## 2013-12-06 ENCOUNTER — Ambulatory Visit (HOSPITAL_BASED_OUTPATIENT_CLINIC_OR_DEPARTMENT_OTHER): Payer: BC Managed Care – PPO | Admitting: Anesthesiology

## 2013-12-06 ENCOUNTER — Encounter (HOSPITAL_BASED_OUTPATIENT_CLINIC_OR_DEPARTMENT_OTHER): Payer: BC Managed Care – PPO | Admitting: Anesthesiology

## 2013-12-06 ENCOUNTER — Encounter (HOSPITAL_BASED_OUTPATIENT_CLINIC_OR_DEPARTMENT_OTHER)
Admission: RE | Disposition: A | Discharge status: Home / Self Care | Payer: Self-pay | Source: Ambulatory Visit | Attending: General Surgery

## 2013-12-06 ENCOUNTER — Encounter (HOSPITAL_BASED_OUTPATIENT_CLINIC_OR_DEPARTMENT_OTHER): Payer: Self-pay | Admitting: *Deleted

## 2013-12-06 DIAGNOSIS — N63 Unspecified lump in unspecified breast: Secondary | ICD-10-CM | POA: Insufficient documentation

## 2013-12-06 DIAGNOSIS — N6310 Unspecified lump in the right breast, unspecified quadrant: Secondary | ICD-10-CM

## 2013-12-06 DIAGNOSIS — M19079 Primary osteoarthritis, unspecified ankle and foot: Secondary | ICD-10-CM | POA: Insufficient documentation

## 2013-12-06 DIAGNOSIS — K219 Gastro-esophageal reflux disease without esophagitis: Secondary | ICD-10-CM | POA: Insufficient documentation

## 2013-12-06 DIAGNOSIS — Z6841 Body Mass Index (BMI) 40.0 and over, adult: Secondary | ICD-10-CM | POA: Insufficient documentation

## 2013-12-06 DIAGNOSIS — N6089 Other benign mammary dysplasias of unspecified breast: Secondary | ICD-10-CM

## 2013-12-06 DIAGNOSIS — D249 Benign neoplasm of unspecified breast: Secondary | ICD-10-CM

## 2013-12-06 DIAGNOSIS — D649 Anemia, unspecified: Secondary | ICD-10-CM | POA: Insufficient documentation

## 2013-12-06 HISTORY — PX: BREAST LUMPECTOMY: SHX2

## 2013-12-06 HISTORY — DX: Gastro-esophageal reflux disease without esophagitis: K21.9

## 2013-12-06 SURGERY — BREAST LUMPECTOMY
Anesthesia: General | Site: Breast | Laterality: Right

## 2013-12-06 MED ORDER — CHLORHEXIDINE GLUCONATE 4 % EX LIQD: 1.0000 "application " | Freq: Once | CUTANEOUS | Status: DC

## 2013-12-06 MED ORDER — DEXAMETHASONE SODIUM PHOSPHATE 4 MG/ML IJ SOLN
INTRAMUSCULAR | Status: DC | PRN
  Administered 2013-12-06: 10 mg via INTRAVENOUS

## 2013-12-06 MED ORDER — MIDAZOLAM HCL 2 MG/2ML IJ SOLN
INTRAMUSCULAR | Status: AC
  Filled 2013-12-06: qty 2

## 2013-12-06 MED ORDER — PROPOFOL 10 MG/ML IV BOLUS
INTRAVENOUS | Status: DC | PRN
  Administered 2013-12-06: 10 mg via INTRAVENOUS

## 2013-12-06 MED ORDER — PROMETHAZINE HCL 25 MG/ML IJ SOLN
6.2500 mg | INTRAMUSCULAR | Status: DC | PRN
Start: 2013-12-06 — End: 2013-12-06

## 2013-12-06 MED ORDER — ACETAMINOPHEN 160 MG/5ML PO SOLN: 325.0000 mg | ORAL | Status: DC | PRN

## 2013-12-06 MED ORDER — MIDAZOLAM HCL 5 MG/5ML IJ SOLN
INTRAMUSCULAR | Status: DC | PRN
  Administered 2013-12-06: 2 mg via INTRAVENOUS

## 2013-12-06 MED ORDER — FENTANYL CITRATE 0.05 MG/ML IJ SOLN: 50.0000 ug | INTRAMUSCULAR | Status: DC | PRN

## 2013-12-06 MED ORDER — OXYCODONE HCL 5 MG PO TABS: 5.0000 mg | ORAL_TABLET | Freq: Once | ORAL | Status: DC | PRN

## 2013-12-06 MED ORDER — LACTATED RINGERS IV SOLN
INTRAVENOUS | Status: DC
  Administered 2013-12-06: 11:00:00 via INTRAVENOUS

## 2013-12-06 MED ORDER — FENTANYL CITRATE 0.05 MG/ML IJ SOLN
INTRAMUSCULAR | Status: AC
  Filled 2013-12-06: qty 4

## 2013-12-06 MED ORDER — CEFAZOLIN SODIUM-DEXTROSE 2-3 GM-% IV SOLR
2.0000 g | INTRAVENOUS | Status: AC
  Administered 2013-12-06: 2 g via INTRAVENOUS

## 2013-12-06 MED ORDER — OXYCODONE HCL 5 MG/5ML PO SOLN: 5.0000 mg | Freq: Once | ORAL | Status: DC | PRN

## 2013-12-06 MED ORDER — ONDANSETRON HCL 4 MG/2ML IJ SOLN
INTRAMUSCULAR | Status: DC | PRN
  Administered 2013-12-06: 4 mg via INTRAVENOUS

## 2013-12-06 MED ORDER — ACETAMINOPHEN 325 MG PO TABS: 325.0000 mg | ORAL_TABLET | ORAL | Status: DC | PRN

## 2013-12-06 MED ORDER — LIDOCAINE HCL (CARDIAC) 20 MG/ML IV SOLN
INTRAVENOUS | Status: DC | PRN
  Administered 2013-12-06: 80 mg via INTRAVENOUS

## 2013-12-06 MED ORDER — FENTANYL CITRATE 0.05 MG/ML IJ SOLN
25.0000 ug | INTRAMUSCULAR | Status: DC | PRN
  Administered 2013-12-06 (×2): 25 ug via INTRAVENOUS
  Administered 2013-12-06: 50 ug via INTRAVENOUS

## 2013-12-06 MED ORDER — BUPIVACAINE-EPINEPHRINE 0.5% -1:200000 IJ SOLN
INTRAMUSCULAR | Status: DC | PRN
  Administered 2013-12-06: 10 mL

## 2013-12-06 MED ORDER — KETOROLAC TROMETHAMINE 30 MG/ML IJ SOLN: 15.0000 mg | Freq: Once | INTRAMUSCULAR | Status: DC | PRN

## 2013-12-06 MED ORDER — CEFAZOLIN SODIUM-DEXTROSE 2-3 GM-% IV SOLR
INTRAVENOUS | Status: AC
  Filled 2013-12-06: qty 50

## 2013-12-06 MED ORDER — HYDROCODONE-ACETAMINOPHEN 5-325 MG PO TABS: 1.0000 | ORAL_TABLET | ORAL | Status: DC | PRN

## 2013-12-06 MED ORDER — FENTANYL CITRATE 0.05 MG/ML IJ SOLN
INTRAMUSCULAR | Status: AC
  Filled 2013-12-06: qty 2

## 2013-12-06 MED ORDER — MIDAZOLAM HCL 2 MG/2ML IJ SOLN: 1.0000 mg | INTRAMUSCULAR | Status: DC | PRN

## 2013-12-06 MED ORDER — BUPIVACAINE-EPINEPHRINE (PF) 0.5% -1:200000 IJ SOLN
INTRAMUSCULAR | Status: AC
  Filled 2013-12-06: qty 30

## 2013-12-06 MED ORDER — PROPOFOL 10 MG/ML IV BOLUS
INTRAVENOUS | Status: AC
  Filled 2013-12-06: qty 20

## 2013-12-06 MED ORDER — FENTANYL CITRATE 0.05 MG/ML IJ SOLN
INTRAMUSCULAR | Status: DC | PRN
  Administered 2013-12-06 (×2): 50 ug via INTRAVENOUS

## 2013-12-06 SURGICAL SUPPLY — 51 items
ADH SKN CLS APL DERMABOND .7 (GAUZE/BANDAGES/DRESSINGS) ×1
BLADE 10 SAFETY STRL DISP (BLADE) IMPLANT
BLADE 15 SAFETY STRL DISP (BLADE) ×3 IMPLANT
BLADE SURG 15 STRL LF DISP TIS (BLADE) IMPLANT
BLADE SURG 15 STRL SS (BLADE) ×3
CANISTER SUCT 1200ML W/VALVE (MISCELLANEOUS) IMPLANT
CHLORAPREP W/TINT 26ML (MISCELLANEOUS) ×3 IMPLANT
CLIP TI MEDIUM 6 (CLIP) IMPLANT
CLIP TI WIDE RED SMALL 6 (CLIP) IMPLANT
COVER MAYO STAND STRL (DRAPES) ×3 IMPLANT
COVER TABLE BACK 60X90 (DRAPES) ×3 IMPLANT
DERMABOND ADVANCED (GAUZE/BANDAGES/DRESSINGS) ×2
DERMABOND ADVANCED .7 DNX12 (GAUZE/BANDAGES/DRESSINGS) IMPLANT
DEVICE DUBIN W/COMP PLATE 8390 (MISCELLANEOUS) IMPLANT
DRAPE PED LAPAROTOMY (DRAPES) ×3 IMPLANT
DRAPE UTILITY XL STRL (DRAPES) ×3 IMPLANT
ELECT COATED BLADE 2.86 ST (ELECTRODE) ×3 IMPLANT
ELECT REM PT RETURN 9FT ADLT (ELECTROSURGICAL) ×3
ELECTRODE REM PT RTRN 9FT ADLT (ELECTROSURGICAL) ×1 IMPLANT
GLOVE BIOGEL PI IND STRL 7.5 (GLOVE) IMPLANT
GLOVE BIOGEL PI IND STRL 8 (GLOVE) ×1 IMPLANT
GLOVE BIOGEL PI INDICATOR 7.5 (GLOVE) ×2
GLOVE BIOGEL PI INDICATOR 8 (GLOVE) ×2
GLOVE SS BIOGEL STRL SZ 7.5 (GLOVE) ×1 IMPLANT
GLOVE SUPERSENSE BIOGEL SZ 7.5 (GLOVE) ×2
GLOVE SURG SS PI 7.5 STRL IVOR (GLOVE) ×2 IMPLANT
GOWN STRL REUS W/ TWL LRG LVL3 (GOWN DISPOSABLE) ×1 IMPLANT
GOWN STRL REUS W/ TWL XL LVL3 (GOWN DISPOSABLE) ×1 IMPLANT
GOWN STRL REUS W/TWL LRG LVL3 (GOWN DISPOSABLE) ×3
GOWN STRL REUS W/TWL XL LVL3 (GOWN DISPOSABLE) ×3
KIT MARKER MARGIN INK (KITS) IMPLANT
NDL HYPO 25X1 1.5 SAFETY (NEEDLE) ×1 IMPLANT
NEEDLE HYPO 25X1 1.5 SAFETY (NEEDLE) ×3 IMPLANT
NS IRRIG 1000ML POUR BTL (IV SOLUTION) ×1 IMPLANT
PACK BASIN DAY SURGERY FS (CUSTOM PROCEDURE TRAY) ×3 IMPLANT
PENCIL BUTTON HOLSTER BLD 10FT (ELECTRODE) ×3 IMPLANT
SLEEVE SCD COMPRESS KNEE MED (MISCELLANEOUS) ×2 IMPLANT
STAPLER VISISTAT 35W (STAPLE) IMPLANT
SUT MON AB 5-0 PS2 18 (SUTURE) ×3 IMPLANT
SUT SILK 3 0 SH 30 (SUTURE) IMPLANT
SUT VIC AB 3-0 SH 27 (SUTURE) ×3
SUT VIC AB 3-0 SH 27X BRD (SUTURE) ×1 IMPLANT
SUT VIC AB 4-0 BRD 54 (SUTURE) IMPLANT
SUT VICRYL 3-0 CR8 SH (SUTURE) IMPLANT
SYR BULB 3OZ (MISCELLANEOUS) IMPLANT
SYR CONTROL 10ML LL (SYRINGE) ×3 IMPLANT
TOWEL OR 17X24 6PK STRL BLUE (TOWEL DISPOSABLE) ×4 IMPLANT
TOWEL OR NON WOVEN STRL DISP B (DISPOSABLE) ×1 IMPLANT
TUBE CONNECTING 20'X1/4 (TUBING)
TUBE CONNECTING 20X1/4 (TUBING) IMPLANT
YANKAUER SUCT BULB TIP NO VENT (SUCTIONS) IMPLANT

## 2013-12-06 NOTE — Op Note (Signed)
Preoperative Diagnosis: right breast mass  Postoprative Diagnosis: right breast mass  Procedure: Procedure(s): RIGHT BREAST LUMPECTOMY   Surgeon: Excell Seltzer T   Assistants:  none  Anesthesia:  General LMA anesthesia  Indications: patient is a 53 year old female with a previous right breast biopsy for benign disease years ago. She presents with a subareolar mass about 1 cm of the right breast. 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 She has a persistent mass and after discussion we have elected to proceed with excision. We discussed the nature of the surgery and indications and risks of anesthesia, bleeding, infection, wound healing problems. She is now brought to the operating room for this procedure.   Procedure Detail:  Patient was brought to the operating room, placed in the supine position on the operating table, and laryngeal mask general anesthesia induced. She received preoperative IV antibiotics. The right breast was widely sterilely prepped and draped. Patient timeout was performed and correct procedure verified. There was a previous circumareolar incision superiorly and I used the medial part of this and extended this medially at the edge of the areola. Dissection was carried down into the subcutaneous tissue. The mass was discrete, about 1 cm and directly behind the areolar skin. The mass was sharply dissected off the areolar skin and then using cautery was excised with a small rim of normal tissue. It appeared to be cystic. There was what appeared to be a dilated duct extending down away from it there was excised with the mass. This was oriented and sent for permanent pathology. It was grossly completely removed. Hemostasis was obtained with cautery. The soft tissue was infiltrated with Marcaine. The deep tissue was closed with interrupted 3-0 Vicryl and the skin with subcuticular Monocryl and Dermabond. Sponge needle and instrument  counts were correct.   Estimated Blood Loss:  Minimal                 Specimens: right breast mass oriented with sutures        Complications:  * No complications entered in OR log *         Disposition: PACU - hemodynamically stable.         Condition: stable

## 2013-12-06 NOTE — Discharge Instructions (Signed)
[facility] °Office Phone Number [phone] ° °BREAST BIOPSY/ LUMPECTOMY: POST OP INSTRUCTIONS ° °Always review your discharge instruction sheet given to you by the facility where your surgery was performed. ° °IF YOU HAVE DISABILITY OR FAMILY LEAVE FORMS, YOU MUST BRING THEM TO THE OFFICE FOR PROCESSING.  DO NOT GIVE THEM TO YOUR DOCTOR. ° °1. A prescription for pain medication may be given to you upon discharge.  Take your pain medication as prescribed, if needed.  If narcotic pain medicine is not needed, then you may take acetaminophen (Tylenol) or ibuprofen (Advil) as needed. °2. Take your usually prescribed medications unless otherwise directed °3. If you need a refill on your pain medication, please contact your pharmacy.  They will contact our office to request authorization.  Prescriptions will not be filled after 5pm or on week-ends. °4. You should eat very light the first 24 hours after surgery, such as soup, crackers, pudding, etc.  Resume your normal diet the day after surgery. °5. Most patients will experience some swelling and bruising in the breast.  Ice packs and a good support bra will help.  Swelling and bruising can take several days to resolve.  °6. It is common to experience some constipation if taking pain medication after surgery.  Increasing fluid intake and taking a stool softener will usually help or prevent this problem from occurring.  A mild laxative (Milk of Magnesia or Miralax) should be taken according to package directions if there are no bowel movements after 48 hours. °7. Unless discharge instructions indicate otherwise, you may remove your bandages 24-48 hours after surgery, and you may shower at that time.  You may have steri-strips (small skin tapes) in place directly over the incision.  These strips should be left on the skin for 7-10 days.  If your surgeon used skin glue on the incision, you may shower in 24 hours.  The glue will flake off over the next 2-3  weeks.  Any sutures or staples will be removed at the office during your follow-up visit. °8. ACTIVITIES:  You may resume regular daily activities (gradually increasing) beginning the next day.  Wearing a good support bra or sports bra minimizes pain and swelling.  You may have sexual intercourse when it is comfortable. °a. You may drive when you no longer are taking prescription pain medication, you can comfortably wear a seatbelt, and you can safely maneuver your car and apply brakes. °b. RETURN TO WORK:  ______________________________________________________________________________________ °9. You should see your doctor in the office for a follow-up appointment approximately two weeks after your surgery.  Your doctor’s nurse will typically make your follow-up appointment when she calls you with your pathology report.  Expect your pathology report 2-3 business days after your surgery.  You may call to check if you do not hear from us after three days. °10. OTHER INSTRUCTIONS: _______________________________________________________________________________________________ _____________________________________________________________________________________________________________________________________ °_____________________________________________________________________________________________________________________________________ °_____________________________________________________________________________________________________________________________________ ° °WHEN TO CALL YOUR DOCTOR: °1. Fever over 101.0 °2. Nausea and/or vomiting. °3. Extreme swelling or bruising. °4. Continued bleeding from incision. °5. Increased pain, redness, or drainage from the incision. ° °The clinic staff is available to answer your questions during regular business hours.  Please don’t hesitate to call and ask to speak to one of the nurses for clinical concerns.  If you have a medical emergency, go to the nearest emergency  room or call 911.  A surgeon from [facility] is always on call at the hospital. ° °For further questions, please visit [email or web address]  ° °  Post Anesthesia Home Care Instructions ° °Activity: °Get plenty of rest for the remainder of the day. A responsible adult should stay with you for 24 hours following the procedure.  °For the next 24 hours, DO NOT: °-Drive a car °-Operate machinery °-Drink alcoholic beverages °-Take any medication unless instructed by your physician °-Make any legal decisions or sign important papers. ° °Meals: °Start with liquid foods such as gelatin or soup. Progress to regular foods as tolerated. Avoid greasy, spicy, heavy foods. If nausea and/or vomiting occur, drink only clear liquids until the nausea and/or vomiting subsides. Call your physician if vomiting continues. ° °Special Instructions/Symptoms: °Your throat may feel dry or sore from the anesthesia or the breathing tube placed in your throat during surgery. If this causes discomfort, gargle with warm salt water. The discomfort should disappear within 24 hours. ° °

## 2013-12-06 NOTE — Anesthesia Procedure Notes (Signed)
Procedure Name: LMA Insertion Date/Time: 12/06/2013 10:41 AM Performed by: Lyndee Leo Pre-anesthesia Checklist: Patient identified, Emergency Drugs available, Suction available and Patient being monitored Patient Re-evaluated:Patient Re-evaluated prior to inductionOxygen Delivery Method: Circle System Utilized Preoxygenation: Pre-oxygenation with 100% oxygen Intubation Type: IV induction Ventilation: Mask ventilation without difficulty LMA: LMA inserted LMA Size: 4.0 Number of attempts: 1 Airway Equipment and Method: bite block Placement Confirmation: positive ETCO2 Tube secured with: Tape Dental Injury: Teeth and Oropharynx as per pre-operative assessment

## 2013-12-06 NOTE — Anesthesia Postprocedure Evaluation (Signed)
  Anesthesia Post-op Note  Patient: Katherine Harris  Procedure(s) Performed: Procedure(s): RIGHT BREAST LUMPECTOMY (Right)  Patient Location: PACU  Anesthesia Type:General  Level of Consciousness: awake, alert  and oriented  Airway and Oxygen Therapy: Patient Spontanous Breathing  Post-op Pain: mild  Post-op Assessment: Post-op Vital signs reviewed, Patient's Cardiovascular Status Stable, Respiratory Function Stable, Patent Airway, No signs of Nausea or vomiting and Pain level controlled  Post-op Vital Signs: Reviewed and stable  Last Vitals:  Filed Vitals:   12/06/13 1125  BP:   Pulse: 78  Temp:   Resp: 18    Complications: No apparent anesthesia complications

## 2013-12-06 NOTE — Anesthesia Preprocedure Evaluation (Signed)
Anesthesia Evaluation  Patient identified by MRN, date of birth, ID band Patient awake    Reviewed: Allergy & Precautions, H&P , NPO status , Patient's Chart, lab work & pertinent test results  History of Anesthesia Complications Negative for: history of anesthetic complications  Airway Mallampati: II TM Distance: >3 FB Neck ROM: Full    Dental  (+) Teeth Intact   Pulmonary neg pulmonary ROS,  breath sounds clear to auscultation        Cardiovascular negative cardio ROS  Rhythm:Regular     Neuro/Psych negative neurological ROS  negative psych ROS   GI/Hepatic Neg liver ROS, GERD-  ,  Endo/Other  Morbid obesity  Renal/GU negative Renal ROS     Musculoskeletal negative musculoskeletal ROS (+)   Abdominal   Peds  Hematology  (+) anemia ,   Anesthesia Other Findings   Reproductive/Obstetrics                           Anesthesia Physical Anesthesia Plan  ASA: II  Anesthesia Plan: General   Post-op Pain Management:    Induction: Intravenous  Airway Management Planned: LMA  Additional Equipment: None  Intra-op Plan:   Post-operative Plan: Extubation in OR  Informed Consent: I have reviewed the patients History and Physical, chart, labs and discussed the procedure including the risks, benefits and alternatives for the proposed anesthesia with the patient or authorized representative who has indicated his/her understanding and acceptance.   Dental advisory given  Plan Discussed with: CRNA and Surgeon  Anesthesia Plan Comments:         Anesthesia Quick Evaluation

## 2013-12-06 NOTE — H&P (Signed)
HPI  Katherine Harris is a very pleasant 53 year old female referred by Dr. Autumn Patty for a right breast mass. The Katherine Harris has a history of an open right breast biopsy through a circumareolar incision some years ago. She does not exactly recall how long or the specific diagnosis although it was benign. The Katherine Harris had not really noticed any change in her breast exam or discrete lump. She recently presented for her screening right mammogram. This revealed 2 possible breast masses on the right side. Ultrasound was then performed showing a benign appearing cyst medially but a circumscribed subareolar mass associated with a dilated duct measuring 15 x 9 x 5 mm at the 12:00 position subareolar. This was felt very likely represent a papilloma and core biopsy was not performed and she is referred for excision. She has had no nipple discharge or breast pain and again had not noticed any palpable masses. She has a remote family history of breast cancer in an aunt only. She has postmenopausal and status post TAH BSO.  Past Medical History   Diagnosis  Date   .  Breast cyst    .  Fibroids    .  HSV infection      TYPE 2   .  Obesity    .  HSV (herpes simplex virus) infection    .  Fibroids    .  Heavy menstrual bleeding    .  Anemia      recently taking otc iron supplement per pt.   .  Reflux      very mild per pt. - no meds   .  Arthritis      right foot pain - otc med   .[email or web address]  Current Outpatient Prescriptions   Medication  Sig  Dispense  Refill   .  BLACK COHOSH PO  Take by mouth.     .  Multiple Vitamins-Minerals (MULTIVITAMINS THER. W/MINERALS) TABS  Take 1 tablet by mouth 2 (two) times a week.     .  naproxen (NAPROSYN) 250 MG tablet  Take by mouth 2 (two) times daily with a meal.     .  VITAMIN E PO  Take by mouth.      No current facility-administered medications for this visit.   No Known Allergies  History   Substance Use Topics   .  Smoking status:  Never Smoker   .  Smokeless tobacco:  Never  Used   .  Alcohol Use:  No   Review of Systems  Constitutional: Negative.  Respiratory: Negative.  Cardiovascular: Negative.  Objective:   Physical Exam  BP 135/87  Pulse 78  Temp(Src) 98.2 F (36.8 C) (Oral)  Resp 20  Ht 5\' 3"  (1.6 m)  Wt 228 lb (103.42 kg)  BMI 40.40 kg/m2  SpO2 98%  LMP 06/10/2011   General: Alert, Moderately obese American female, in no distress  Skin: Warm and dry without rash or infection.  Breasts: There is a well-healed superior circumareolar incision on the right. Beneath the right areola at the 12:00 position is a well-circumscribed firm freely movable mass measuring just under 1 cm. It is very superficial. I cannot feel any other abnormalities in either breast.  Lymph nodes: No cervical, supraclavicular, or inguinal nodes palpable.  Lungs: Breath sounds clear and equal without increased work of breathing  Cardiovascular: Regular rate and rhythm without murmur. No JVD or edema.  Extremities: No edema or joint swelling or deformity. No chronic venous stasis changes.  Neurologic: Alert and  fully oriented.    Assessment:   Right breast mass. 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 It is easily palpable in the subareolar position on the right. I've recommended surgical excision to rule out a small chance of malignancy. We discussed the indications for the procedure and its nature as well as risks of anesthetic complications, bleeding, infection and possible diagnoses and recovery. She was given literature and all her questions were answered.  Plan:   Right breast lumpectomy under general anesthesia as an outpatient

## 2013-12-06 NOTE — Transfer of Care (Signed)
Immediate Anesthesia Transfer of Care Note  Patient: Katherine Harris  Procedure(s) Performed: Procedure(s): RIGHT BREAST LUMPECTOMY (Right)  Patient Location: PACU  Anesthesia Type:General  Level of Consciousness: awake, sedated and patient cooperative  Airway & Oxygen Therapy: Patient Spontanous Breathing and Patient connected to face mask oxygen  Post-op Assessment: Report given to PACU RN and Post -op Vital signs reviewed and stable  Post vital signs: Reviewed and stable  Complications: No apparent anesthesia complications

## 2013-12-08 ENCOUNTER — Encounter (HOSPITAL_BASED_OUTPATIENT_CLINIC_OR_DEPARTMENT_OTHER): Payer: Self-pay | Admitting: General Surgery

## 2013-12-08 ENCOUNTER — Telehealth (INDEPENDENT_AMBULATORY_CARE_PROVIDER_SITE_OTHER): Payer: Self-pay | Admitting: General Surgery

## 2013-12-08 NOTE — Telephone Encounter (Signed)
Called the patient in left message regarding pathology report

## 2013-12-23 ENCOUNTER — Encounter (INDEPENDENT_AMBULATORY_CARE_PROVIDER_SITE_OTHER): Payer: BC Managed Care – PPO | Admitting: General Surgery

## 2013-12-29 ENCOUNTER — Ambulatory Visit (INDEPENDENT_AMBULATORY_CARE_PROVIDER_SITE_OTHER): Payer: BC Managed Care – PPO | Admitting: General Surgery

## 2013-12-29 ENCOUNTER — Encounter (INDEPENDENT_AMBULATORY_CARE_PROVIDER_SITE_OTHER): Payer: Self-pay | Admitting: General Surgery

## 2013-12-29 VITALS — BP 126/70 | HR 84 | Temp 97.5°F | Ht 62.0 in | Wt 232.0 lb

## 2013-12-29 DIAGNOSIS — Z09 Encounter for follow-up examination after completed treatment for conditions other than malignant neoplasm: Secondary | ICD-10-CM

## 2013-12-29 NOTE — Progress Notes (Signed)
History: Patient returns for her postoperative visit following right breast lumpectomy for papilloma. She reports no problems.  Exam: BP 126/70  Pulse 84  Temp(Src) 97.5 F (36.4 C)  Ht 5\' 2"  (1.575 m)  Wt 232 lb (105.235 kg)  BMI 42.42 kg/m2  LMP 06/10/2011 Breasts: Right breast incision well healed without thickening, seroma or other complication  Pathology: Confirmed benign papilloma  Assessment and plan: Doing well following right breast lumpectomy as above. She is encouraged to continue yearly screening mammograms and monthly self-exam and will return as needed.

## 2014-07-07 ENCOUNTER — Encounter: Payer: BC Managed Care – PPO | Admitting: Family Medicine

## 2014-07-13 ENCOUNTER — Ambulatory Visit (INDEPENDENT_AMBULATORY_CARE_PROVIDER_SITE_OTHER): Payer: BC Managed Care – PPO | Admitting: Family Medicine

## 2014-07-13 ENCOUNTER — Encounter: Payer: Self-pay | Admitting: Gastroenterology

## 2014-07-13 ENCOUNTER — Encounter: Payer: Self-pay | Admitting: Family Medicine

## 2014-07-13 VITALS — BP 128/68 | HR 70 | Temp 98.0°F | Ht 61.0 in | Wt 222.8 lb

## 2014-07-13 DIAGNOSIS — R519 Headache, unspecified: Secondary | ICD-10-CM | POA: Insufficient documentation

## 2014-07-13 DIAGNOSIS — G8929 Other chronic pain: Secondary | ICD-10-CM

## 2014-07-13 DIAGNOSIS — Z0000 Encounter for general adult medical examination without abnormal findings: Secondary | ICD-10-CM

## 2014-07-13 DIAGNOSIS — Z01419 Encounter for gynecological examination (general) (routine) without abnormal findings: Secondary | ICD-10-CM

## 2014-07-13 DIAGNOSIS — R51 Headache: Secondary | ICD-10-CM

## 2014-07-13 DIAGNOSIS — Z1211 Encounter for screening for malignant neoplasm of colon: Secondary | ICD-10-CM

## 2014-07-13 LAB — COMPREHENSIVE METABOLIC PANEL
ALBUMIN: 4.2 g/dL (ref 3.5–5.2)
ALT: 9 U/L (ref 0–35)
AST: 17 U/L (ref 0–37)
Alkaline Phosphatase: 67 U/L (ref 39–117)
BUN: 14 mg/dL (ref 6–23)
CALCIUM: 9.5 mg/dL (ref 8.4–10.5)
CHLORIDE: 104 meq/L (ref 96–112)
CO2: 29 meq/L (ref 19–32)
Creatinine, Ser: 0.8 mg/dL (ref 0.4–1.2)
GFR: 97.2 mL/min (ref >60.00)
Glucose, Bld: 92 mg/dL (ref 70–99)
POTASSIUM: 4.2 meq/L (ref 3.5–5.1)
Sodium: 138 mEq/L (ref 135–145)
Total Bilirubin: 0.6 mg/dL (ref 0.2–1.2)
Total Protein: 8 g/dL (ref 6.0–8.3)

## 2014-07-13 LAB — CBC WITH DIFFERENTIAL/PLATELET
BASOS PCT: 0.5 % (ref 0.0–3.0)
Basophils Absolute: 0 10*3/uL (ref 0.0–0.1)
Eosinophils Absolute: 0.1 10*3/uL (ref 0.0–0.7)
Eosinophils Relative: 2.2 % (ref 0.0–5.0)
HCT: 45 % (ref 36.0–46.0)
Hemoglobin: 14.3 g/dL (ref 12.0–15.0)
LYMPHS PCT: 36.2 % (ref 12.0–46.0)
Lymphs Abs: 1.5 10*3/uL (ref 0.7–4.0)
MCHC: 31.7 g/dL (ref 30.0–36.0)
MCV: 90.2 fl (ref 78.0–100.0)
MONOS PCT: 9.1 % (ref 3.0–12.0)
Monocytes Absolute: 0.4 10*3/uL (ref 0.1–1.0)
Neutro Abs: 2.1 10*3/uL (ref 1.4–7.7)
Neutrophils Relative %: 52 % (ref 43.0–77.0)
Platelets: 196 10*3/uL (ref 150.0–400.0)
RBC: 4.99 Mil/uL (ref 3.87–5.11)
RDW: 13.5 % (ref 11.5–15.5)
WBC: 4.1 10*3/uL (ref 4.0–10.5)

## 2014-07-13 LAB — LIPID PANEL
CHOL/HDL RATIO: 4
Cholesterol: 188 mg/dL (ref 0–200)
HDL: 42.6 mg/dL (ref >39.00)
LDL Cholesterol: 129 mg/dL — ABNORMAL HIGH (ref 0–99)
NONHDL: 145.4
Triglycerides: 80 mg/dL (ref 0.0–149.0)
VLDL: 16 mg/dL (ref 0.0–40.0)

## 2014-07-13 LAB — TSH: TSH: 1.73 u[IU]/mL (ref 0.35–4.50)

## 2014-07-13 NOTE — Progress Notes (Signed)
Subjective:   Patient ID: Katherine Harris, female    DOB: 1960-11-11, 53 y.o.   MRN: [id number]  Katherine Harris is a pleasant 54 y.o. year old female who presents to clinic today with Annual Exam and Headache  on 07/13/2014  HPI: I have not seen her in over 3 years.  Mammogram 08/06/13.  Had a breast biopsy- benign- Dr. Excell Seltzer in 12/2013. Remote h/o hysterectomy. Refusing influenza vaccine. S/p complete hysterectomy/oophrectomy. Last eye exam 4 years ago.  Has never had a colonoscopy but did see Dr. Fuller Plan for endoscopy/dilatation in 10/2010.  Left sided headaches- past 2-3 months.  Every few days, morning HA - varies where it occurs.  Usually frontal or temporal.  Laying down originally alleviated pain, now has to take alleve for it to resolve completely (but not every time).  Very "faint pain." Not associated with nausea, vomiting or photophobia. No speech difficulty or other focal neurological symptoms. No associated tearing or rhinorrhea.  Current Outpatient Prescriptions on File Prior to Visit  Medication Sig Dispense Refill  . BLACK COHOSH PO Take by mouth.    . fexofenadine-pseudoephedrine (ALLEGRA-D) 60-120 MG per tablet Take 1 tablet by mouth 2 (two) times daily as needed.    . fluticasone (FLONASE) 50 MCG/ACT nasal spray Place 2 sprays into both nostrils 2 (two) times daily as needed for allergies or rhinitis.    . Multiple Vitamins-Minerals (MULTIVITAMINS THER. W/MINERALS) TABS Take 1 tablet by mouth 2 (two) times a week.      . naproxen (NAPROSYN) 250 MG tablet Take by mouth 2 (two) times daily with a meal.    . VITAMIN E PO Take by mouth.     No current facility-administered medications on file prior to visit.    No Known Allergies  Past Medical History  Diagnosis Date  . Breast cyst   . Fibroids   . HSV infection     TYPE 2  . Obesity   . HSV (herpes simplex virus) infection   . Fibroids   . Heavy menstrual bleeding   . Anemia     recently taking otc iron  supplement per pt.  . Reflux     very mild per pt. - no meds  . Arthritis     right foot pain - otc med  . GERD (gastroesophageal reflux disease)     Past Surgical History  Procedure Laterality Date  . Cesarean section  07/22/1990  . Upper gastrointestinal endoscopy    . Wisdom tooth extraction    . Svd  1997    x 1  . Breast excisional biopsy      bilateral  .  esophageal  stretching  09/2010  . Missed abortion    . Salpingoophorectomy  08/26/2011    Procedure: SALPINGO OOPHERECTOMY;  Surgeon: Myra C. Hulan Fray, MD;  Location: [facility];  Service: Gynecology;  Laterality: Bilateral;  . Cystoscopy  08/26/2011    Procedure: CYSTOSCOPY;  Surgeon: Myra C. Hulan Fray, MD;  Location: [facility];  Service: Gynecology;  Laterality: N/A;  . Abdominal hysterectomy    . Breast lumpectomy Right 12/06/2013    Procedure: RIGHT BREAST LUMPECTOMY;  Surgeon: Edward Jolly, MD;  Location: [facility];  Service: General;  Laterality: Right;    Family History  Problem Relation Age of Onset  . Hypertension Mother   . Liver cancer Mother   . Cancer Mother 51    Liver Cancer  . Leukemia Father   . Stomach cancer Maternal  Aunt   . Cancer Maternal Aunt 38    Breast Cancer one breast  . Breast cancer Maternal Aunt   . Colon cancer Neg Hx   . Cancer Paternal Grandmother 11    Breast Cancer  . Cancer Cousin 27    Brain Cancer    History   Social History  . Marital Status: Single    Spouse Name: N/A    Number of Children: N/A  . Years of Education: N/A   Occupational History  . Teacher [employer]   Social History Main Topics  . Smoking status: Never Smoker   . Smokeless tobacco: Never Used  . Alcohol Use: No  . Drug Use: No  . Sexual Activity: Not Currently    Birth Control/ Protection: None   Other Topics Concern  . Not on file   Social History Narrative   The PMH, PSH, Social History, Family History, Medications, and allergies have been reviewed in [facility], and have  been updated if relevant.  Review of Systems  Constitutional: Negative.   Eyes: Negative for photophobia and visual disturbance.  Respiratory: Negative.   Cardiovascular: Negative.   Gastrointestinal: Negative.   Endocrine: Negative.   Genitourinary: Negative.   Musculoskeletal: Negative.   Skin: Negative.   Allergic/Immunologic: Negative.   Neurological: Positive for headaches. Negative for seizures.  Hematological: Negative.   Psychiatric/Behavioral: Negative.   All other systems reviewed and are negative.      Objective:    BP 128/68 mmHg  Pulse 70  Temp(Src) 98 F (36.7 C) (Oral)  Ht 5\' 1"  (1.549 m)  Wt 222 lb 12 oz (101.039 kg)  BMI 42.11 kg/m2  SpO2 98%  LMP 06/10/2011   Physical Exam    General:  Well-developed,well-nourished,in no acute distress; alert,appropriate and cooperative throughout examination Head:  normocephalic and atraumatic.   Eyes:  vision grossly intact, pupils equal, pupils round, and pupils reactive to light.   Ears:  R ear normal and L ear normal.   Nose:  no external deformity.   Mouth:  good dentition.   Neck:  No deformities, masses, or tenderness noted. Breasts:  No mass, nodules, thickening, tenderness, bulging, retraction, inflamation, nipple discharge or skin changes noted.   Lungs:  Normal respiratory effort, chest expands symmetrically. Lungs are clear to auscultation, no crackles or wheezes. Heart:  Normal rate and regular rhythm. S1 and S2 normal without gallop, murmur, click, rub or other extra sounds. Abdomen:  Bowel sounds positive,abdomen soft and non-tender without masses, organomegaly or hernias noted. Msk:  No deformity or scoliosis noted of thoracic or lumbar spine.   Extremities:  No clubbing, cyanosis, edema, or deformity noted with normal full range of motion of all joints.   Neurologic:  alert & oriented X3 and gait normal.   Neg romberg, CN II- XII intact Skin:  Intact without suspicious lesions or rashes Cervical  Nodes:  No lymphadenopathy noted Axillary Nodes:  No palpable lymphadenopathy Psych:  Cognition and judgment appear intact. Alert and cooperative with normal attention span and concentration. No apparent delusions, illusions, hallucinations      Assessment & Plan:   Well woman exam No Follow-up on file.

## 2014-07-13 NOTE — Assessment & Plan Note (Signed)
Reviewed preventive care protocols, scheduled due services, and updated immunizations Discussed nutrition, exercise, diet, and healthy lifestyle.  GI referral placed for screening colonoscopy. Declines influenza vaccine.  Orders Placed This Encounter  Procedures  . CBC with Differential  . Comprehensive metabolic panel  . Lipid panel  . TSH  . Ambulatory referral to Gastroenterology

## 2014-07-13 NOTE — Patient Instructions (Addendum)
Great to see you. Happy Holidays. Please call Dr. Lear Ng office about your mammogram.  Please keep a headache journal and come see me in 1 month.  Please schedule an eye exam.

## 2014-07-13 NOTE — Assessment & Plan Note (Signed)
New- ?tension vs needing eye exam for adjustment of her glasses. No red flag symptoms, normal neurological exam. Advised keeping headache journal, follow up in 1 month, sooner if she develops any red flag symptoms like vomiting, visual changes, increased severity, speech difficulties, etc. The patient indicates understanding of these issues and agrees with the plan.

## 2014-07-13 NOTE — Progress Notes (Signed)
Pre visit review using our clinic review tool, if applicable. No additional management support is needed unless otherwise documented below in the visit note. 

## 2014-07-14 ENCOUNTER — Encounter: Payer: Self-pay | Admitting: *Deleted

## 2014-09-19 ENCOUNTER — Ambulatory Visit (AMBULATORY_SURGERY_CENTER): Payer: Self-pay | Admitting: *Deleted

## 2014-09-19 VITALS — Ht 63.0 in | Wt 223.0 lb

## 2014-09-19 DIAGNOSIS — Z1211 Encounter for screening for malignant neoplasm of colon: Secondary | ICD-10-CM

## 2014-09-19 MED ORDER — MOVIPREP 100 G PO SOLR: 1.0000 | Freq: Once | ORAL | Status: DC

## 2014-09-19 NOTE — Progress Notes (Signed)
No egg or soy allergy No diet pills No home 02 use No issues with past sedation Pt declined emmi video

## 2014-09-26 ENCOUNTER — Encounter: Payer: Self-pay | Admitting: Gastroenterology

## 2014-10-03 ENCOUNTER — Encounter: Payer: BC Managed Care – PPO | Admitting: Gastroenterology

## 2014-10-13 ENCOUNTER — Ambulatory Visit (AMBULATORY_SURGERY_CENTER): Payer: BC Managed Care – PPO | Admitting: Gastroenterology

## 2014-10-13 ENCOUNTER — Encounter: Payer: Self-pay | Admitting: Gastroenterology

## 2014-10-13 VITALS — BP 134/72 | HR 67 | Temp 96.6°F | Resp 16 | Ht 63.0 in | Wt 223.0 lb

## 2014-10-13 DIAGNOSIS — Z1211 Encounter for screening for malignant neoplasm of colon: Secondary | ICD-10-CM | POA: Diagnosis not present

## 2014-10-13 MED ORDER — SODIUM CHLORIDE 0.9 % IV SOLN: 500.0000 mL | INTRAVENOUS | Status: DC

## 2014-10-13 NOTE — Progress Notes (Signed)
Procedure ends, to recovery, report to [employer], RN, VSS

## 2014-10-13 NOTE — Op Note (Signed)
[address] [place], [address]   COLONOSCOPY PROCEDURE REPORT  PATIENT: Katherine Harris, Katherine Harris  MR#: [id number] BIRTHDATE: 03-23-1961 , 53  yrs. old GENDER: female ENDOSCOPIST: Ladene Artist, MD, [facility] REFERRED TK:ZSWFU Aron, M.D. PROCEDURE DATE:  10/13/2014 PROCEDURE:   Colonoscopy, screening First Screening Colonoscopy - Avg.  risk and is 50 yrs.  old or older Yes.  Prior Negative Screening - Now for repeat screening. N/A  History of Adenoma - Now for follow-up colonoscopy & has been > or = to 3 yrs.  N/A ASA CLASS:   Class II INDICATIONS:Screening for colonic neoplasia and Colorectal Neoplasm Risk Assessment for this procedure is average risk. MEDICATIONS: Monitored anesthesia care and Propofol 250 mg IV DESCRIPTION OF PROCEDURE:   After the risks benefits and alternatives of the procedure were thoroughly explained, informed consent was obtained.  The digital rectal exam revealed no abnormalities of the rectum.   The [id number]  endoscope was introduced through the anus and advanced to the cecum, which was identified by both the appendix and ileocecal valve. No adverse events experienced.   The quality of the prep was excellent. (MoviPrep was used)  The instrument was then slowly withdrawn as the colon was fully examined.    COLON FINDINGS: There was mild diverticulosis noted in the sigmoid colon.   The colonic mucosa appeared normal at the splenic flexure, in the transverse colon, rectum, descending colon, at the ileocecal valve, cecum, hepatic flexure, and in the ascending colon. Retroflexed views revealed no abnormalities. The time to cecum = 2.1 Withdrawal time = 9.8   The scope was withdrawn and the procedure completed. COMPLICATIONS: There were no immediate complications.  ENDOSCOPIC IMPRESSION: 1.   Mild diverticulosis was noted in the sigmoid colon 2.   The colon otherwise appeared normal  RECOMMENDATIONS: 1.  High fiber  diet with liberal fluid intake. 2.  Continue current colorectal screening recommendations for "routine risk" patients with a repeat colonoscopy in 10 years.  eSigned:  Ladene Artist, MD, [facility] 10/13/2014 11:56 AM

## 2014-10-13 NOTE — Patient Instructions (Signed)
Handouts for Diverticulosis, and High Fiber Diet.    YOU HAD AN ENDOSCOPIC PROCEDURE TODAY AT [facility]:   Refer to the procedure report that was given to you for any specific questions about what was found during the examination.  If the procedure report does not answer your questions, please call your gastroenterologist to clarify.  If you requested that your care partner not be given the details of your procedure findings, then the procedure report has been included in a sealed envelope for you to review at your convenience later.  YOU SHOULD EXPECT: Some feelings of bloating in the abdomen. Passage of more gas than usual.  Walking can help get rid of the air that was put into your GI tract during the procedure and reduce the bloating. If you had a lower endoscopy (such as a colonoscopy or flexible sigmoidoscopy) you may notice spotting of blood in your stool or on the toilet paper. If you underwent a bowel prep for your procedure, you may not have a normal bowel movement for a few days.  Please Note:  You might notice some irritation and congestion in your nose or some drainage.  This is from the oxygen used during your procedure.  There is no need for concern and it should clear up in a day or so.  SYMPTOMS TO REPORT IMMEDIATELY:   Following lower endoscopy (colonoscopy or flexible sigmoidoscopy):  Excessive amounts of blood in the stool  Significant tenderness or worsening of abdominal pains  Swelling of the abdomen that is new, acute  Fever of 100F or higher   For urgent or emergent issues, a gastroenterologist can be reached at any hour by calling [phone].   DIET: Your first meal following the procedure should be a small meal and then it is ok to progress to your normal diet. Heavy or fried foods are harder to digest and may make you feel nauseous or bloated.  Likewise, meals heavy in dairy and vegetables can increase bloating.  Drink plenty of fluids but you  should avoid alcoholic beverages for 24 hours.  ACTIVITY:  You should plan to take it easy for the rest of today and you should NOT DRIVE or use heavy machinery until tomorrow (because of the sedation medicines used during the test).    FOLLOW UP: Our staff will call the number listed on your records the next business day following your procedure to check on you and address any questions or concerns that you may have regarding the information given to you following your procedure. If we do not reach you, we will leave a message.  However, if you are feeling well and you are not experiencing any problems, there is no need to return our call.  We will assume that you have returned to your regular daily activities without incident.  If any biopsies were taken you will be contacted by phone or by letter within the next 1-3 weeks.  Please call us at [phone] if you have not heard about the biopsies in 3 weeks.    SIGNATURES/CONFIDENTIALITY: You and/or your care partner have signed paperwork which will be entered into your electronic medical record.  These signatures attest to the fact that that the information above on your After Visit Summary has been reviewed and is understood.  Full responsibility of the confidentiality of this discharge information lies with you and/or your care-partner.

## 2014-10-17 ENCOUNTER — Telehealth: Payer: Self-pay

## 2014-10-17 NOTE — Telephone Encounter (Signed)
Left message on answering machine. 

## 2016-01-29 ENCOUNTER — Other Ambulatory Visit: Payer: Self-pay | Admitting: Family Medicine

## 2016-01-29 DIAGNOSIS — Z1231 Encounter for screening mammogram for malignant neoplasm of breast: Secondary | ICD-10-CM

## 2016-02-19 ENCOUNTER — Ambulatory Visit
Admission: RE | Admit: 2016-02-19 | Discharge: 2016-02-19 | Disposition: A | Discharge status: Home / Self Care | Payer: BC Managed Care – PPO | Source: Ambulatory Visit | Attending: Family Medicine | Admitting: Family Medicine

## 2016-02-19 DIAGNOSIS — Z1231 Encounter for screening mammogram for malignant neoplasm of breast: Secondary | ICD-10-CM

## 2016-02-29 ENCOUNTER — Encounter (INDEPENDENT_AMBULATORY_CARE_PROVIDER_SITE_OTHER): Payer: Self-pay

## 2016-05-30 DIAGNOSIS — M2142 Flat foot [pes planus] (acquired), left foot: Secondary | ICD-10-CM | POA: Insufficient documentation

## 2016-05-30 DIAGNOSIS — M25572 Pain in left ankle and joints of left foot: Secondary | ICD-10-CM | POA: Insufficient documentation

## 2016-05-30 DIAGNOSIS — M7742 Metatarsalgia, left foot: Secondary | ICD-10-CM | POA: Insufficient documentation

## 2016-07-01 ENCOUNTER — Telehealth: Payer: Self-pay | Admitting: Family Medicine

## 2016-07-01 NOTE — Telephone Encounter (Signed)
Pt has appt on 07/02/16 at 10:45 with Dr Deborra Medina.

## 2016-07-01 NOTE — Telephone Encounter (Signed)
[facility]  Patient Name: Katherine Harris  DOB: 09-05-1960    Initial Comment Caller states she had fallen in [employer] on Saturday and she does have a bruise on her right leg and pain in her left foot.    Nurse Assessment  Nurse: Harlow Mares, RN, Suanne Marker Date/Time Eilene Ghazi Time): 07/01/2016 3:56:40 PM  Confirm and document reason for call. If symptomatic, describe symptoms. ---Caller states she had fallen in [employer] on Saturday and she does have a bruise on her right leg and pain in her left foot. Reports that she is walking with a slight limp.  Does the patient have any new or worsening symptoms? ---Yes  Will a triage be completed? ---Yes  Related visit to physician within the last 2 weeks? ---No  Does the PT have any chronic conditions? (i.e. diabetes, asthma, etc.) ---No  Is the patient pregnant or possibly pregnant? (Ask all females between the ages of 35-55) ---No  Is this a behavioral health or substance abuse call? ---No     Guidelines    Guideline Title Affirmed Question Affirmed Notes  Foot and Ankle Injury [1] Limp when walking AND [2] due to a twisted ankle or foot    Final Disposition User   See Physician within 24 Hours [place], RN, Suanne Marker    Comments  Caller scheduled to see Dr. Deborra Medina at 10:15am on 07/02/16 at the [facility] location. Caller voiced understanding.   Referrals  REFERRED TO PCP OFFICE   Disagree/Comply: Comply

## 2016-07-02 ENCOUNTER — Encounter (INDEPENDENT_AMBULATORY_CARE_PROVIDER_SITE_OTHER): Payer: Self-pay

## 2016-07-02 ENCOUNTER — Ambulatory Visit (INDEPENDENT_AMBULATORY_CARE_PROVIDER_SITE_OTHER)
Admission: RE | Admit: 2016-07-02 | Discharge: 2016-07-02 | Disposition: A | Discharge status: Home / Self Care | Payer: BC Managed Care – PPO | Source: Ambulatory Visit | Attending: Family Medicine | Admitting: Family Medicine

## 2016-07-02 ENCOUNTER — Ambulatory Visit (INDEPENDENT_AMBULATORY_CARE_PROVIDER_SITE_OTHER): Payer: BC Managed Care – PPO | Admitting: Family Medicine

## 2016-07-02 VITALS — BP 124/84 | HR 70 | Temp 98.2°F | Wt 224.8 lb

## 2016-07-02 DIAGNOSIS — S99912A Unspecified injury of left ankle, initial encounter: Secondary | ICD-10-CM

## 2016-07-02 NOTE — Progress Notes (Signed)
Pre visit review using our clinic review tool, if applicable. No additional management support is needed unless otherwise documented below in the visit note. 

## 2016-07-02 NOTE — Patient Instructions (Signed)
Ankle Sprain Introduction An ankle sprain is a stretch or tear in one of the tough tissues (ligaments) in your ankle. Follow these instructions at home:  Rest your ankle.  Take over-the-counter and prescription medicines only as told by your doctor.  For 2-3 days, keep your ankle higher than the level of your heart (elevated) as much as possible.  If directed, put ice on the area:  Put ice in a plastic bag.  Place a towel between your skin and the bag.  Leave the ice on for 20 minutes, 2-3 times a day.  If you were given a brace:  Wear it as told.  Take it off to shower or bathe.  Try not to move your ankle much, but wiggle your toes from time to time. This helps to prevent swelling.  If you were given an elastic bandage (dressing):  Take it off when you shower or bathe.  Try not to move your ankle much, but wiggle your toes from time to time. This helps to prevent swelling.  Adjust the bandage to make it more comfortable if it feels too tight.  Loosen the bandage if you lose feeling in your foot, your foot tingles, or your foot gets cold and blue.  If you have crutches, use them as told by your doctor. Continue to use them until you can walk without feeling pain in your ankle. Contact a doctor if:  Your bruises or swelling are quickly getting worse.  Your pain does not get better after you take medicine. Get help right away if:  You cannot feel your toes or foot.  Your toes or your foot looks blue.  You have very bad pain that gets worse. This information is not intended to replace advice given to you by your health care provider. Make sure you discuss any questions you have with your health care provider. Document Released: 12/25/2007 Document Revised: 12/14/2015 Document Reviewed: 02/07/2015  2017 Elsevier  

## 2016-07-02 NOTE — Progress Notes (Signed)
SUBJECTIVE: Katherine Harris is a 55 y.o. female who complains of inversion injury to the left ankle 3 day(s) ago. Immediate symptoms: immediate pain, immediate swelling. Symptoms have been constant since that time. Prior history of related problems: no prior problems with this area in the past. There is pain and swelling at the lateral aspect of that ankle.   Current Outpatient Prescriptions on File Prior to Visit  Medication Sig Dispense Refill  . Multiple Vitamins-Minerals (MULTIVITAMINS THER. W/MINERALS) TABS Take 1 tablet by mouth 2 (two) times a week.      . naproxen (NAPROSYN) 500 MG tablet Take 500 mg by mouth.    Marland Kitchen VITAMIN E PO Take by mouth.     No current facility-administered medications on file prior to visit.     No Known Allergies  Past Medical History:  Diagnosis Date  . Anemia    recently taking otc iron supplement per pt.  . Arthritis    right foot pain - otc med, knees   . Breast cyst   . Fibroids   . Fibroids   . GERD (gastroesophageal reflux disease)   . Heavy menstrual bleeding   . HSV (herpes simplex virus) infection   . HSV infection    TYPE 2  . Obesity   . Reflux    very mild per pt. - no meds    Past Surgical History:  Procedure Laterality Date  .  esophageal  stretching  09/2010  . ABDOMINAL HYSTERECTOMY    . BREAST EXCISIONAL BIOPSY     bilateral  . BREAST LUMPECTOMY Right 12/06/2013   Procedure: RIGHT BREAST LUMPECTOMY;  Surgeon: Edward Jolly, MD;  Location: [facility];  Service: General;  Laterality: Right;  . CESAREAN SECTION  07/22/1990  . CYSTOSCOPY  08/26/2011   Procedure: CYSTOSCOPY;  Surgeon: Myra C. Hulan Fray, MD;  Location: [facility];  Service: Gynecology;  Laterality: N/A;  . missed abortion    . SALPINGOOPHORECTOMY  08/26/2011   Procedure: SALPINGO OOPHERECTOMY;  Surgeon: Myra C. Hulan Fray, MD;  Location: [facility];  Service: Gynecology;  Laterality: Bilateral;  . SVD  1997   x 1  . UPPER GASTROINTESTINAL ENDOSCOPY    . WISDOM  TOOTH EXTRACTION      Family History  Problem Relation Age of Onset  . Hypertension Mother   . Liver cancer Mother   . Cancer Mother 110    Liver Cancer  . Leukemia Father   . Liver cancer Maternal Aunt 38    Breast Cancer one breast  . Breast cancer Maternal Aunt   . Colon cancer Neg Hx   . Stomach cancer Neg Hx   . Cancer Paternal Grandmother 59    Breast Cancer  . Breast cancer Paternal Grandmother   . Cancer Cousin 54    Brain Cancer    Social History   Social History  . Marital status: Single    Spouse name: N/A  . Number of children: N/A  . 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 Smoking status: Never Smoker  . Smokeless tobacco: Never Used  . Alcohol use No  . Drug use: No  . Sexual activity: Not Currently    Birth control/ protection: None   Other Topics Concern  . Not on file   Social History Narrative  . No narrative on file   The PMH, PSH, Social History, Family History, Medications, and allergies  have been reviewed in [facility], and have been updated if relevant.  OBJECTIVE: BP 124/84   Pulse 70   Temp 98.2 F (36.8 C) (Oral)   Wt 224 lb 12 oz (101.9 kg)   LMP 06/10/2011   SpO2 98%   BMI 39.81 kg/m   She appears well, vital signs are normal. There is swelling and tenderness over the lateral malleolus. No tenderness over the medial aspect of the ankle. The fifth metatarsal is not tender. The ankle joint is intact without excessive opening on stressing. X-ray: ordered, but results not yet available The rest of the foot, ankle and leg exam is normal.  ASSESSMENT: Ankle  sprain  PLAN: rest the injured area as much as practical, apply ice packs, elevate the injured limb, compressive bandage, X-Ray ordered See orders for this visit as documented in the electronic medical record.

## 2016-12-23 ENCOUNTER — Ambulatory Visit (INDEPENDENT_AMBULATORY_CARE_PROVIDER_SITE_OTHER): Payer: BC Managed Care – PPO | Admitting: Family Medicine

## 2016-12-23 ENCOUNTER — Telehealth: Payer: Self-pay | Admitting: Family Medicine

## 2016-12-23 VITALS — BP 120/80 | HR 86 | Temp 98.8°F | Ht 61.0 in | Wt 220.0 lb

## 2016-12-23 DIAGNOSIS — J0110 Acute frontal sinusitis, unspecified: Secondary | ICD-10-CM

## 2016-12-23 MED ORDER — AMOXICILLIN-POT CLAVULANATE 875-125 MG PO TABS: 1.0000 | ORAL_TABLET | Freq: Two times a day (BID) | ORAL | 0 refills | Status: AC

## 2016-12-23 NOTE — Patient Instructions (Signed)
Take antibiotic as directed.  Drink lots of fluids.    Treat sympotmatically with your current over the counter medications. You can use warm compresses.    Call if not improving as expected in 5-7 days.

## 2016-12-23 NOTE — Telephone Encounter (Signed)
Pt has appt with Dr Deborra Medina 12/23/16 at 10:15.

## 2016-12-23 NOTE — Telephone Encounter (Signed)
[facility]  Patient Name: Katherine Harris  DOB: 03/28/1961    Initial Comment Caller states she has a cold. Left eye red and has a discharge. Sharlene Dory    Nurse Assessment  Nurse: Wynetta Emery, RN, Baker Janus Date/Time [facility] Time): 12/23/2016 7:07:27 AM  Confirm and document reason for call. If symptomatic, describe symptoms. ---[name] started getting sick with cough, sore throat, congestion, eyes watering, loosing voice, progressively getting worse now has discharge from eyes along with sore throat  Does the patient have any new or worsening symptoms? ---Yes  Will a triage be completed? ---Yes  Related visit to physician within the last 2 weeks? ---No  Does the PT have any chronic conditions? (i.e. diabetes, asthma, etc.) ---Unknown  Is the patient pregnant or possibly pregnant? (Ask all females between the ages of 40-55) ---No  Is this a behavioral health or substance abuse call? ---No     Guidelines    Guideline Title Affirmed Question Affirmed Notes  Cough - Acute Productive Cough    Final Disposition User   See Physician within 24 Hours [place], RN, Baker Janus    Comments  NOTE: 12/23/2016 1015am Arnette Norris r/o URI vs PNA with eye discharge   Referrals  REFERRED TO PCP OFFICE   Disagree/Comply: Comply

## 2016-12-23 NOTE — Progress Notes (Signed)
SUBJECTIVE:  Katherine Harris is a 56 y.o. female who complains of coryza, congestion, sore throat, dry cough, bilateral sinus pain and itching in eyes for 8 days. She denies a history of anorexia and chest pain and denies a history of asthma. Patient denies smoke cigarettes.   Current Outpatient Prescriptions on File Prior to Visit  Medication Sig Dispense Refill  . Multiple Vitamins-Minerals (MULTIVITAMINS THER. W/MINERALS) TABS Take 1 tablet by mouth 2 (two) times a week.      . naproxen (NAPROSYN) 500 MG tablet Take 500 mg by mouth.    Marland Kitchen VITAMIN E PO Take by mouth.     No current facility-administered medications on file prior to visit.     No Known Allergies  Past Medical History:  Diagnosis Date  . Anemia    recently taking otc iron supplement per pt.  . Arthritis    right foot pain - otc med, knees   . Breast cyst   . Fibroids   . Fibroids   . GERD (gastroesophageal reflux disease)   . Heavy menstrual bleeding   . HSV (herpes simplex virus) infection   . HSV infection    TYPE 2  . Obesity   . Reflux    very mild per pt. - no meds    Past Surgical History:  Procedure Laterality Date  .  esophageal  stretching  09/2010  . ABDOMINAL HYSTERECTOMY    . BREAST EXCISIONAL BIOPSY     bilateral  . BREAST LUMPECTOMY Right 12/06/2013   Procedure: RIGHT BREAST LUMPECTOMY;  Surgeon: Edward Jolly, MD;  Location: [facility];  Service: General;  Laterality: Right;  . CESAREAN SECTION  07/22/1990  . CYSTOSCOPY  08/26/2011   Procedure: CYSTOSCOPY;  Surgeon: Myra C. Hulan Fray, MD;  Location: [facility];  Service: Gynecology;  Laterality: N/A;  . missed abortion    . SALPINGOOPHORECTOMY  08/26/2011   Procedure: SALPINGO OOPHERECTOMY;  Surgeon: Myra C. Hulan Fray, MD;  Location: [facility];  Service: Gynecology;  Laterality: Bilateral;  . SVD  1997   x 1  . UPPER GASTROINTESTINAL ENDOSCOPY    . WISDOM TOOTH EXTRACTION      Family History  Problem Relation Age of Onset  .  Hypertension Mother   . Liver cancer Mother   . Cancer Mother 45       Liver Cancer  . Leukemia Father   . Liver cancer Maternal Aunt 38       Breast Cancer one breast  . Breast cancer Maternal Aunt   . Colon cancer Neg Hx   . Stomach cancer Neg Hx   . Cancer Paternal Grandmother 42       Breast Cancer  . Breast cancer Paternal Grandmother   . Cancer Cousin 22       Brain Cancer    Social History   Social History  . Marital status: Single    Spouse name: N/A  . Number of children: N/A  . 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 Smoking status: Never Smoker  . Smokeless tobacco: Never Used  . Alcohol use No  . Drug use: No  . Sexual activity: Not Currently    Birth control/ protection: None   Other Topics Concern  . Not on file   Social History Narrative  . No narrative on file   The PMH, PSH, Social History, Family History, Medications, and allergies  have been reviewed in [facility], and have been updated if relevant.  OBJECTIVE: BP 120/80   Pulse 86   Temp 98.8 F (37.1 C)   Ht 5\' 1"  (1.549 m)   Wt 220 lb (99.8 kg)   LMP 06/10/2011   SpO2 97%   BMI 41.57 kg/m   She appears well, vital signs are as noted. Ears normal.  Throat and pharynx normal.  Neck supple. No adenopathy in the neck. Nose is congested. Sinuses tender. The chest is clear, without wheezes or rales.  ASSESSMENT:  sinusitis  PLAN: Given duration and progression of symptoms, will treat for bacterial sinusitis.  Symptomatic therapy suggested: push fluids, rest and return office visit prn if symptoms persist or worsen. Call or return to clinic prn if these symptoms worsen or fail to improve as anticipated.

## 2016-12-25 ENCOUNTER — Telehealth: Payer: Self-pay

## 2016-12-25 MED ORDER — POLYMYXIN B-TRIMETHOPRIM 10000-0.1 UNIT/ML-% OP SOLN: 2.0000 [drp] | OPHTHALMIC | 0 refills | Status: DC

## 2016-12-25 NOTE — Telephone Encounter (Signed)
PLEASE NOTE: All timestamps contained within this report are represented as [place] Standard Time. CONFIDENTIALTY NOTICE: This fax transmission is intended only for the addressee. It contains information that is legally privileged, confidential or otherwise protected from use or disclosure. If you are not the intended recipient, you are strictly prohibited from reviewing, disclosing, copying using or disseminating any of this information or taking any action in reliance on or regarding this information. If you have received this fax in error, please notify us immediately by telephone so that we can arrange for its return to [place]. Phone: [phone], Toll-Free: [phone], Fax: [phone] Page: 1 of 1 Call Id: [id number] [place] Patient Name: Katherine Harris Gender: Female DOB: [birth year] Age: 56 Y 63 M 30 D Return Phone Number: [phone] (Primary), [phone] (Secondary) City/State/Zip: [place] Client [place] Night - Client Client Site [place] Physician Arnette Norris - MD Who Is Calling Patient / Member / Family / Caregiver Call Type Triage / Clinical Relationship To Patient Self Return Phone Number [phone] (Secondary) Chief Complaint Eye Pus Or Discharge Reason for Call Symptomatic / Request for Health Information Initial Comment Caller states been taking Amox, she has redness in eye, itchy feeling and mucus coming from eye No Triage Reason Other Nurse Assessment Nurse: Lavera Guise, RN, Vaughan Basta Date/Time (Eastern Time): 12/25/2016 7:55:09 AM Confirm and document reason for call. If symptomatic, describe symptoms. ---Caller states she saw the Dr. on Monday and has been taking Amoxicillin for coughing, sore throat, headache, facial pain and left eye redness and discharge. Caller states everything is better except for left eye which is  still red with discharge, yellow and clear. Caller states she is at work and uncomfortable working with others because of the eye. Nurse discharge not indicated. Caller has already been evaluated. Will contact the Dr. for decision concerning eye drops. Select reason for no triage. ---Other Please document clinical information provided and list any resource used. ---Nurse discharge not indicated. Caller has already been evaluated. Will contact the Dr. for decision concerning eye drops. Guidelines Guideline Title Affirmed Question Disp. Time Eilene Ghazi Time) Disposition Final User 12/25/2016 8:19:01 AM Clinical Call Yes Lavera Guise, RN, Vaughan Basta Comments User: Ricard Dillon, RN Date/Time Eilene Ghazi Time): 12/25/2016 8:18:51 AM Called office to ok script for eye drops. Was instructed to fax record over to let Dr. Deborra Medina make decision concerning eye drops for patient. Let patient know Dr. Deborra Medina would be reviewing record and making decision for treatment.

## 2016-12-25 NOTE — Telephone Encounter (Signed)
Left detailed message.   

## 2016-12-25 NOTE — Telephone Encounter (Signed)
eRx for eye drops sent to [facility].

## 2017-02-21 ENCOUNTER — Other Ambulatory Visit: Payer: Self-pay | Admitting: Family Medicine

## 2017-02-21 DIAGNOSIS — Z1231 Encounter for screening mammogram for malignant neoplasm of breast: Secondary | ICD-10-CM

## 2017-02-26 ENCOUNTER — Ambulatory Visit
Admission: RE | Admit: 2017-02-26 | Discharge: 2017-02-26 | Disposition: A | Discharge status: Home / Self Care | Payer: BC Managed Care – PPO | Source: Ambulatory Visit

## 2017-02-26 ENCOUNTER — Inpatient Hospital Stay: Admission: RE | Admit: 2017-02-26 | Payer: BC Managed Care – PPO | Source: Ambulatory Visit

## 2017-02-26 ENCOUNTER — Ambulatory Visit: Payer: BC Managed Care – PPO

## 2017-02-26 DIAGNOSIS — Z1231 Encounter for screening mammogram for malignant neoplasm of breast: Secondary | ICD-10-CM

## 2017-05-21 ENCOUNTER — Ambulatory Visit: Payer: BC Managed Care – PPO | Admitting: Family Medicine

## 2018-03-03 ENCOUNTER — Other Ambulatory Visit: Payer: Self-pay | Admitting: Family Medicine

## 2018-03-03 DIAGNOSIS — Z1231 Encounter for screening mammogram for malignant neoplasm of breast: Secondary | ICD-10-CM

## 2018-04-06 ENCOUNTER — Ambulatory Visit: Payer: BC Managed Care – PPO

## 2018-04-29 ENCOUNTER — Encounter: Payer: Self-pay | Admitting: Family Medicine

## 2018-04-29 ENCOUNTER — Ambulatory Visit: Payer: BC Managed Care – PPO | Admitting: Family Medicine

## 2018-04-29 DIAGNOSIS — L918 Other hypertrophic disorders of the skin: Secondary | ICD-10-CM | POA: Diagnosis not present

## 2018-04-29 DIAGNOSIS — G4700 Insomnia, unspecified: Secondary | ICD-10-CM

## 2018-04-29 DIAGNOSIS — D229 Melanocytic nevi, unspecified: Secondary | ICD-10-CM | POA: Diagnosis not present

## 2018-04-29 MED ORDER — TRAZODONE HCL 50 MG PO TABS: 25.0000 mg | ORAL_TABLET | Freq: Every evening | ORAL | 3 refills | Status: DC | PRN

## 2018-04-29 NOTE — Patient Instructions (Signed)
Great to see you.  Start trazodone 25- 50 mg nightly.  Please update in few weeks.

## 2018-04-29 NOTE — Assessment & Plan Note (Signed)
>  25 min spent with patient, at least half of which was spent on counseling insomnia.  The problem of recurrent insomnia is discussed. Avoidance of caffeine sources is strongly encouraged. Sleep hygiene issues are reviewed.  eRx sent for trazodone- 25-50 mg nightly for insomnia. She will update me in a few weeks.

## 2018-04-29 NOTE — Progress Notes (Signed)
Subjective:   Patient ID: Katherine Harris, female    DOB: 10/31/1960, 57 y.o.   MRN: [id number]  Katherine Harris is a pleasant 57 y.o. year old female who presents to clinic today with unspecified sleep disturbance (Patient is here today to discuss problems with sleeping. She has tried melatonin and sleepy time tea, breathing actiities, but they don't always help.  She has problems getting to sleep then gets up after about 2 hours and takes a while to get back to sleep.  ) and Skin Problem (She also states that she would like to have you look at a mole on her neck on the left side near her clavicle.  It measures 56mm x 29mm and has grown outward to be 41mm.  She noticed a change in May and has gotten bigger since.)  on 04/29/2018  HPI:  Insomnia- Difficulty falling and staying asleep. Has tried melatonin, sleepy time tea, breathing exercises but they don't seem to be effective. States that she does not feel depressed or anxious.  Nevus on her neck- noticed it in May 2019 and it has grown in size since.  Current Outpatient Medications on File Prior to Visit  Medication Sig Dispense Refill  . Melatonin 5 MG TABS Take by mouth.    . naproxen (NAPROSYN) 500 MG tablet Take 500 mg by mouth.     No current facility-administered medications on file prior to visit.     No Known Allergies  Past Medical History:  Diagnosis Date  . Anemia    recently taking otc iron supplement per pt.  . Arthritis    right foot pain - otc med, knees   . Breast cyst   . Fibroids   . Fibroids   . GERD (gastroesophageal reflux disease)   . Heavy menstrual bleeding   . HSV (herpes simplex virus) infection   . HSV infection    TYPE 2  . Obesity   . Reflux    very mild per pt. - no meds    Past Surgical History:  Procedure Laterality Date  .  esophageal  stretching  09/2010  . ABDOMINAL HYSTERECTOMY    . BREAST EXCISIONAL BIOPSY     bilateral  . BREAST LUMPECTOMY Right 12/06/2013   Procedure: RIGHT  BREAST LUMPECTOMY;  Surgeon: Edward Jolly, MD;  Location: [facility];  Service: General;  Laterality: Right;  . CESAREAN SECTION  07/22/1990  . CYSTOSCOPY  08/26/2011   Procedure: CYSTOSCOPY;  Surgeon: Myra C. Hulan Fray, MD;  Location: [facility];  Service: Gynecology;  Laterality: N/A;  . missed abortion    . SALPINGOOPHORECTOMY  08/26/2011   Procedure: SALPINGO OOPHERECTOMY;  Surgeon: Myra C. Hulan Fray, MD;  Location: [facility];  Service: Gynecology;  Laterality: Bilateral;  . SVD  1997   x 1  . UPPER GASTROINTESTINAL ENDOSCOPY    . WISDOM TOOTH EXTRACTION      Family History  Problem Relation Age of Onset  . Hypertension Mother   . Liver cancer Mother   . Cancer Mother 75       Liver Cancer  . Leukemia Father   . Liver cancer Maternal Aunt 38       Breast Cancer one breast  . Breast cancer Maternal Aunt   . Colon cancer Neg Hx   . Stomach cancer Neg Hx   . Cancer Paternal Grandmother 87       Breast Cancer  . Breast cancer Paternal Grandmother   . Cancer  Cousin 24       Brain Cancer    Social History   Socioeconomic History  . Marital status: Single    Spouse name: Not on file  . Number of children: Not on file  . Years of education: Not on file  . Highest education level: Not on file  Occupational History  . Occupation: Product manager: [employer]  . Financial resource strain: Not on file  . Food insecurity:    Worry: Not on file    Inability: Not on file  . Transportation needs:    Medical: Not on file    Non-medical: Not on file  Tobacco Use  . Smoking status: Never Smoker  . Smokeless tobacco: Never Used  Substance and Sexual Activity  . Alcohol use: No  . Drug use: No  . Sexual activity: Not Currently    Birth control/protection: None  Lifestyle  . Physical activity:    Days per week: Not on file    Minutes per session: Not on file  . Stress: Not on file  Relationships  . Social connections:    Talks on phone: Not  on file    Gets together: Not on file    Attends religious service: Not on file    Active member of club or organization: Not on file    Attends meetings of clubs or organizations: Not on file    Relationship status: Not on file  . Intimate partner violence:    Fear of current or ex partner: Not on file    Emotionally abused: Not on file    Physically abused: Not on file    Forced sexual activity: Not on file  Other Topics Concern  . Not on file  Social History Narrative  . Not on file   The PMH, PSH, Social History, Family History, Medications, and allergies have been reviewed in [facility], and have been updated if relevant.   Review of Systems  Constitutional: Positive for fatigue.  Skin:       + changing nevus   Psychiatric/Behavioral: Positive for sleep disturbance. Negative for agitation, behavioral problems, confusion, decreased concentration, dysphoric mood, hallucinations, self-injury and suicidal ideas. The patient is not nervous/anxious and is not hyperactive.   All other systems reviewed and are negative.      Objective:    BP 102/60 (BP Location: Left Arm, Patient Position: Sitting, Cuff Size: Normal)   Pulse 76   Temp 98.7 F (37.1 C) (Oral)   Ht 5' 1.5" (1.562 m)   Wt 229 lb (103.9 kg)   LMP 06/10/2011   SpO2 97%   BMI 42.57 kg/m    Physical Exam  Constitutional: She is oriented to person, place, and time. She appears well-developed and well-nourished. No distress.  HENT:  Head: Normocephalic and atraumatic.  Eyes: EOM are normal.  Neck: Normal range of motion.  Cardiovascular: Normal rate.  Pulmonary/Chest: Effort normal.  Musculoskeletal: Normal range of motion. She exhibits no edema.  Neurological: She is alert and oriented to person, place, and time. No cranial nerve deficit. Coordination normal.  Skin: She is not diaphoretic.     Psychiatric: She has a normal mood and affect. Her behavior is normal. Judgment and thought content normal.  Nursing  note and vitals reviewed.         Assessment & Plan:   Insomnia, unspecified type  Nevus, atypical  Skin tag No follow-ups on file.

## 2018-04-29 NOTE — Assessment & Plan Note (Signed)
New-  The risks (including bleeding and infection) and benefits of the procedure and Written informed consent obtained. Using sterile iris scissors, multiple skin tags were snipped off at their bases after cleansing with Betadine.  Bleeding was controlled by pressure.   Findings: Pathognomonic benign lesions  not sent for pathological exam.  Condition: Stable  Complications: none.  Plan: 1. Instructed to keep the wounds dry and covered for 24-48h and clean thereafter. 2. Warning signs of infection were reviewed.   3. Recommended that the patient use OTC acetaminophen as needed for pain.  4. Return as needed.

## 2018-05-05 ENCOUNTER — Ambulatory Visit
Admission: RE | Admit: 2018-05-05 | Discharge: 2018-05-05 | Disposition: A | Discharge status: Home / Self Care | Payer: BC Managed Care – PPO | Source: Ambulatory Visit | Attending: Family Medicine | Admitting: Family Medicine

## 2018-05-05 DIAGNOSIS — Z1231 Encounter for screening mammogram for malignant neoplasm of breast: Secondary | ICD-10-CM

## 2018-09-11 ENCOUNTER — Encounter: Payer: Self-pay | Admitting: Family Medicine

## 2018-09-11 ENCOUNTER — Ambulatory Visit: Payer: BC Managed Care – PPO | Admitting: Family Medicine

## 2018-09-11 VITALS — BP 118/90 | HR 76 | Temp 98.0°F | Resp 16 | Ht 62.0 in | Wt 232.0 lb

## 2018-09-11 DIAGNOSIS — N644 Mastodynia: Secondary | ICD-10-CM

## 2018-09-11 DIAGNOSIS — N6452 Nipple discharge: Secondary | ICD-10-CM

## 2018-09-11 LAB — CBC WITH DIFFERENTIAL/PLATELET
ABSOLUTE MONOCYTES: 432 {cells}/uL (ref 200–950)
BASOS ABS: 48 {cells}/uL (ref 0–200)
BASOS PCT: 1 %
EOS ABS: 110 {cells}/uL (ref 15–500)
Eosinophils Relative: 2.3 %
HCT: 43.1 % (ref 35.0–45.0)
Hemoglobin: 14.1 g/dL (ref 11.7–15.5)
Lymphs Abs: 1709 cells/uL (ref 850–3900)
MCH: 29.4 pg (ref 27.0–33.0)
MCHC: 32.7 g/dL (ref 32.0–36.0)
MCV: 90 fL (ref 80.0–100.0)
MPV: 12.3 fL (ref 7.5–12.5)
Monocytes Relative: 9 %
NEUTROS PCT: 52.1 %
Neutro Abs: 2501 cells/uL (ref 1500–7800)
PLATELETS: 189 10*3/uL (ref 140–400)
RBC: 4.79 10*6/uL (ref 3.80–5.10)
RDW: 11.5 % (ref 11.0–15.0)
TOTAL LYMPHOCYTE: 35.6 %
WBC: 4.8 10*3/uL (ref 3.8–10.8)

## 2018-09-11 LAB — COMPREHENSIVE METABOLIC PANEL
AG Ratio: 1.3 (calc) (ref 1.0–2.5)
ALKALINE PHOSPHATASE (APISO): 63 U/L (ref 37–153)
ALT: 8 U/L (ref 6–29)
AST: 15 U/L (ref 10–35)
Albumin: 4.1 g/dL (ref 3.6–5.1)
BUN: 15 mg/dL (ref 7–25)
CO2: 27 mmol/L (ref 20–32)
CREATININE: 0.85 mg/dL (ref 0.50–1.05)
Calcium: 9.6 mg/dL (ref 8.6–10.4)
Chloride: 104 mmol/L (ref 98–110)
GLOBULIN: 3.1 g/dL (ref 1.9–3.7)
Glucose, Bld: 92 mg/dL (ref 65–99)
POTASSIUM: 4.2 mmol/L (ref 3.5–5.3)
SODIUM: 138 mmol/L (ref 135–146)
Total Bilirubin: 0.4 mg/dL (ref 0.2–1.2)
Total Protein: 7.2 g/dL (ref 6.1–8.1)

## 2018-09-11 NOTE — Progress Notes (Signed)
Subjective:    Patient ID: Katherine Harris, female    DOB: 11/02/1960, 58 y.o.   MRN: [id number]  HPI  Presents to clinic c/o left breast tenderness, nipple discharge from right breast off and on for 4-5 months. States the nipple discharge will occur from right breast 1 or 2 times a month.  States she had discharge from one of her nipples many years ago, had biopsy done and everything was negative.   Mammogram done 04/2018 IMPRESSION: No mammographic evidence of malignancy. A result letter of this screening mammogram will be mailed directly to the patient.  RECOMMENDATION: Screening mammogram in one year. (Code:SM-B-01Y)  BI-RADS CATEGORY  1: Negative.  Patient Active Problem List   Diagnosis Date Noted  . Insomnia 04/29/2018  . Skin tag 04/29/2018  . Chronic headaches 07/13/2014  . Urge incontinence 04/18/2011  . Left sided abdominal pain 04/18/2011  . Special screening for malignant neoplasms, colon 10/15/2010  . DYSPHAGIA UNSPECIFIED 09/05/2010  . UTERINE FIBROID 09/18/2006   Social History   Tobacco Use  . Smoking status: Never Smoker  . Smokeless tobacco: Never Used  Substance Use Topics  . Alcohol use: No   Review of Systems   Constitutional: Negative for chills, fatigue and fever.  HENT: Negative for congestion, ear pain, sinus pain and sore throat.   Eyes: Negative.   Respiratory: Negative for cough, shortness of breath and wheezing.   Cardiovascular: Negative for chest pain, palpitations and leg swelling.  Breast: Left breast tenderness, discharge from nipple Gastrointestinal: Negative for abdominal pain, diarrhea, nausea and vomiting.  Genitourinary: Negative for dysuria, frequency and urgency.  Musculoskeletal: Negative for arthralgias and myalgias.  Skin: Negative for color change, pallor and rash.  Neurological: Negative for syncope, light-headedness and headaches.  Psychiatric/Behavioral: The patient is not nervous/anxious.       Objective:   Physical Exam Vitals signs and nursing note reviewed.  Constitutional:      General: She is not in acute distress.    Appearance: She is not toxic-appearing.  HENT:     Head: Normocephalic and atraumatic.  Neck:     Musculoskeletal: Normal range of motion and neck supple.  Cardiovascular:     Rate and Rhythm: Normal rate and regular rhythm.  Pulmonary:     Effort: Pulmonary effort is normal. No respiratory distress.     Breath sounds: Normal breath sounds.  Chest:     Breasts:        Right: Nipple discharge (not currently having discharge) and tenderness present. No swelling, bleeding, inverted nipple, mass or skin change.        Left: Tenderness (left more tender than right) present. No swelling, bleeding, inverted nipple, mass or nipple discharge.  Lymphadenopathy:     Upper Body:     Right upper body: No supraclavicular, axillary or pectoral adenopathy.     Left upper body: No supraclavicular, axillary or pectoral adenopathy.  Skin:    General: Skin is warm and dry.     Coloration: Skin is not jaundiced or pale.     Findings: No erythema.  Neurological:     Mental Status: She is alert and oriented to person, place, and time.  Psychiatric:        Mood and Affect: Mood normal.        Behavior: Behavior normal.    Vitals:   09/11/18 1551  BP: 118/90  Pulse: 76  Resp: 16  Temp: 98 F (36.7 C)  SpO2: 98%  Assessment & Plan:   Tenderness of left breast, right nipple discharge- due to patient's symptoms will order diagnostic mammogram.  On exam no obvious mass or breast abnormality is noted.  Patient aware she will be contacted in regards to diagnostic mammogram being set up.  Blood work drawn in clinic today including CBC and CMP to look for any elevated white cells, anemia, changes in electrolytes.  Results of lab work and digoxin mammogram will lead [place] to neck step in plan of care.  Patient will otherwise keep regularly scheduled follow-up with PCP as planned and  return to clinic sooner if any issues arise.

## 2018-09-14 ENCOUNTER — Telehealth: Payer: Self-pay | Admitting: Family Medicine

## 2018-09-14 NOTE — Telephone Encounter (Signed)
Copied from [place] [phone]. Topic: Quick Communication - See Telephone Encounter >> Sep 14, 2018  4:15 PM Blase Mess A wrote: CRM for notification. See Telephone encounter for: 09/14/18.  Patient is calling back for lab results Please advise [phone]

## 2018-09-14 NOTE — Telephone Encounter (Signed)
Charted in result notes. 

## 2018-09-25 ENCOUNTER — Telehealth: Payer: Self-pay | Admitting: *Deleted

## 2018-09-25 NOTE — Telephone Encounter (Signed)
Will forward to [facility] for scheduling

## 2018-09-25 NOTE — Telephone Encounter (Signed)
Copied from [place] [phone]. Topic: General - Inquiry >> Sep 25, 2018 11:11 AM Virl Axe D wrote: Reason for CRM: Pt saw Philis Nettle on 09/11/18 and was told that a diagnostic appt would be scheduled for her regarding discharge coming from breast. She has not heard anything else regarding this and would like a callback. Please advise.[phone]

## 2018-09-29 ENCOUNTER — Other Ambulatory Visit: Payer: Self-pay | Admitting: Family Medicine

## 2018-09-29 DIAGNOSIS — N644 Mastodynia: Secondary | ICD-10-CM

## 2018-09-29 DIAGNOSIS — N6452 Nipple discharge: Secondary | ICD-10-CM

## 2018-10-12 ENCOUNTER — Ambulatory Visit: Payer: BC Managed Care – PPO

## 2018-10-12 ENCOUNTER — Other Ambulatory Visit: Payer: Self-pay

## 2018-10-12 ENCOUNTER — Ambulatory Visit
Admission: RE | Admit: 2018-10-12 | Discharge: 2018-10-12 | Disposition: A | Discharge status: Home / Self Care | Payer: BC Managed Care – PPO | Source: Ambulatory Visit | Attending: Family Medicine | Admitting: Family Medicine

## 2018-10-12 DIAGNOSIS — N6452 Nipple discharge: Secondary | ICD-10-CM

## 2018-10-12 DIAGNOSIS — N644 Mastodynia: Secondary | ICD-10-CM

## 2018-12-17 ENCOUNTER — Encounter: Payer: Self-pay | Admitting: Internal Medicine

## 2018-12-17 ENCOUNTER — Ambulatory Visit (INDEPENDENT_AMBULATORY_CARE_PROVIDER_SITE_OTHER): Payer: BC Managed Care – PPO | Admitting: Internal Medicine

## 2018-12-17 ENCOUNTER — Telehealth: Payer: Self-pay | Admitting: Family Medicine

## 2018-12-17 DIAGNOSIS — H1012 Acute atopic conjunctivitis, left eye: Secondary | ICD-10-CM

## 2018-12-17 NOTE — Telephone Encounter (Signed)
Pt wants to stay at [facility]. She would like to Transfer of Care to [facility]. Is this approved from both providers?

## 2018-12-17 NOTE — Progress Notes (Signed)
Virtual Visit via Video Note  I connected with Katherine Harris on 12/17/18 at 10:45 AM EDT by a video enabled telemedicine application and verified that I am speaking with the correct person using two identifiers.  Location: Patient: Home Provider: Office   I discussed the limitations of evaluation and management by telemedicine and the availability of in person appointments. The patient expressed understanding and agreed to proceed.  History of Present Illness:  Pt reports intermittent headache, left eye pain, redness, drainage and sore throat. This started yesterday. She describes the headache a pressure on the left side of her head. She denies visual changes. She reports her eye is draining clear mucous. She denies runny nose, nasal congestion, ear pain, cough or shortness of breath. She denies fever, chills or body aches. She has tried Visine Eye Drop and warm compresses with some relief. She has not had sick contacts.  Past Medical History:  Diagnosis Date  . Anemia    recently taking otc iron supplement per pt.  . Arthritis    right foot pain - otc med, knees   . Breast cyst   . Fibroids   . Fibroids   . GERD (gastroesophageal reflux disease)   . Heavy menstrual bleeding   . HSV (herpes simplex virus) infection   . HSV infection    TYPE 2  . Obesity   . Reflux    very mild per pt. - no meds    Current Outpatient Medications  Medication Sig Dispense Refill  . Melatonin 5 MG TABS Take by mouth.    . naproxen (NAPROSYN) 500 MG tablet Take 500 mg by mouth.    . traZODone (DESYREL) 50 MG tablet Take 0.5-1 tablets (25-50 mg total) by mouth at bedtime as needed for sleep. 30 tablet 3   No current facility-administered medications for this visit.     No Known Allergies  Family History  Problem Relation Age of Onset  . Hypertension Mother   . Liver cancer Mother   . Cancer Mother 40       Liver Cancer  . Leukemia Father   . Liver cancer Maternal Aunt 38       Breast  Cancer one breast  . Breast cancer Maternal Aunt   . Cancer Paternal Grandmother 74       Breast Cancer  . Breast cancer Paternal Grandmother   . Cancer Cousin 14       Brain Cancer  . Colon cancer Neg Hx   . Stomach cancer Neg Hx     Social History   Socioeconomic History  . Marital status: Single    Spouse name: Not on file  . Number of children: Not on file  . Years of education: Not on file  . Highest education level: Not on file  Occupational History  . Occupation: Product manager: [employer]  . Financial resource strain: Not on file  . Food insecurity:    Worry: Not on file    Inability: Not on file  . Transportation needs:    Medical: Not on file    Non-medical: Not on file  Tobacco Use  . Smoking status: Never Smoker  . Smokeless tobacco: Never Used  Substance and Sexual Activity  . Alcohol use: No  . Drug use: No  . Sexual activity: Not Currently    Birth control/protection: None  Lifestyle  . Physical activity:    Days per week: Not on file  Minutes per session: Not on file  . Stress: Not on file  Relationships  . Social connections:    Talks on phone: Not on file    Gets together: Not on file    Attends religious service: Not on file    Active member of club or organization: Not on file    Attends meetings of clubs or organizations: Not on file    Relationship status: Not on file  . Intimate partner violence:    Fear of current or ex partner: Not on file    Emotionally abused: Not on file    Physically abused: Not on file    Forced sexual activity: Not on file  Other Topics Concern  . Not on file  Social History Narrative  . Not on file     Constitutional: Pt reports headache. Denies fever, malaise, fatigue,  or abrupt weight changes.  HEENT: Pt reports left eye pain, redness, discharge, sore throat. Denies  ear pain, ringing in the ears, wax buildup, runny nose, nasal congestion, bloody nose. Respiratory:  Denies difficulty breathing, shortness of breath, cough or sputum production.   Cardiovascular: Denies chest pain, chest tightness, palpitations or swelling in the hands or feet.   No other specific complaints in a complete review of systems (except as listed in HPI above).  LMP 06/10/2011  Wt Readings from Last 3 Encounters:  09/11/18 232 lb (105.2 kg)  04/29/18 229 lb (103.9 kg)  12/23/16 220 lb (99.8 kg)    General: Appears her stated age, well developed, well nourished in NAD. HEENT: Head: normal shape and size; Eyes: sclera white, no icterus, conjunctiva pink, EOMs intact; No obvious drainage. Pulmonary/Chest: Normal effort and positive vesicular breath sounds. No respiratory distress. No wheezes, rales or ronchi noted.   Neurological: Alert and oriented.   BMET    Component Value Date/Time   NA 138 09/11/2018 1558   K 4.2 09/11/2018 1558   CL 104 09/11/2018 1558   CO2 27 09/11/2018 1558   GLUCOSE 92 09/11/2018 1558   BUN 15 09/11/2018 1558   CREATININE 0.85 09/11/2018 1558   CALCIUM 9.6 09/11/2018 1558    Lipid Panel     Component Value Date/Time   CHOL 188 07/13/2014 1250   TRIG 80.0 07/13/2014 1250   HDL 42.60 07/13/2014 1250   CHOLHDL 4 07/13/2014 1250   VLDL 16.0 07/13/2014 1250   LDLCALC 129 (H) 07/13/2014 1250    CBC    Component Value Date/Time   WBC 4.8 09/11/2018 1558   RBC 4.79 09/11/2018 1558   HGB 14.1 09/11/2018 1558   HCT 43.1 09/11/2018 1558   PLT 189 09/11/2018 1558   MCV 90.0 09/11/2018 1558   MCH 29.4 09/11/2018 1558   MCHC 32.7 09/11/2018 1558   RDW 11.5 09/11/2018 1558   LYMPHSABS 1,709 09/11/2018 1558   MONOABS 0.4 07/13/2014 1250   EOSABS 110 09/11/2018 1558   BASOSABS 48 09/11/2018 1558    Hgb A1C No results found for: HGBA1C      Assessment and Plan:  Allergic Conjunctivitis, Left Eye:  Cool compresses TID Start Claritin or Allegra OTC daily x 1 week Use Visine Allergy eye drops  Follow Up Instructions:    I  discussed the assessment and treatment plan with the patient. The patient was provided an opportunity to ask questions and all were answered. The patient agreed with the plan and demonstrated an understanding of the instructions.   The patient was advised to call back or seek an  in-person evaluation if the symptoms worsen or if the condition fails to improve as anticipated.    Webb Silversmith, NP

## 2018-12-17 NOTE — Patient Instructions (Signed)
Allergic Conjunctivitis  A clear membrane (conjunctiva) covers the white part of your eye and the inner surface of your eyelid. Allergic conjunctivitis happens when this membrane has inflammation. This is caused by allergies. Common causes of allergic reactions (allergens)include:  · Outdoor allergens, such as:  ? Pollen.  ? Grass and weeds.  ? Mold spores.  · Indoor allergens, such as:  ? Dust.  ? Smoke.  ? Mold.  ? Pet dander.  ? Animal hair.  This condition can make your eye red or pink. It can also make your eye feel itchy. This condition cannot be spread from one person to another person (is not contagious).  Follow these instructions at home:  · Try not to be around things that you are allergic to.  · Take or apply over-the-counter and prescription medicines only as told by your doctor. These include any eye drops.  · Place a cool, clean washcloth on your eye for 10-20 minutes. Do this 3-4 times a day.  · Do not touch or rub your eyes.  · Do not wear contact lenses until the inflammation is gone. Wear glasses instead.  · Do not wear eye makeup until the inflammation is gone.  · Keep all follow-up visits as told by your doctor. This is important.  Contact a doctor if:  · Your symptoms get worse.  · Your symptoms do not get better with treatment.  · You have mild eye pain.  · You are sensitive to light,  · You have spots or blisters on your eyes.  · You have pus coming from your eye.  · You have a fever.  Get help right away if:  · You have redness, swelling, or other symptoms in only one eye.  · Your vision is blurry.  · You have vision changes.  · You have very bad eye pain.  Summary  · Allergic conjunctivitis is caused by allergies. It can make your eye red or pink, and it can make your eye feel itchy.  · This condition cannot be spread from one person to another person (is not contagious).  · Try not to be around things that you are allergic to.  · Take or apply over-the-counter and prescription medicines  only as told by your doctor. These include any eye drops.  · Contact your doctor if your symptoms get worse or they do not get better with treatment.  This information is not intended to replace advice given to you by your health care provider. Make sure you discuss any questions you have with your health care provider.  Document Released: 12/26/2009 Document Revised: 03/01/2016 Document Reviewed: 03/01/2016  Elsevier Interactive Patient Education © 2019 [employer]vier Inc[employer]

## 2018-12-17 NOTE — Telephone Encounter (Signed)
Fine with me

## 2018-12-17 NOTE — Telephone Encounter (Addendum)
Pt having lt eye irritation; feel like sand in eye,redness, clear discharge from eye. Pain in lt eye. No fever, chills,SOB,muscle pain,diarrhea,and no loss of taste/smell. Pt had discomfort in throat this morning but not quite a scratchy throat yet. H/A on lt side yesterday but none today. Pt has virtual visit today at 10:45.

## 2019-01-27 ENCOUNTER — Other Ambulatory Visit: Payer: Self-pay | Admitting: *Deleted

## 2019-01-27 DIAGNOSIS — Z20822 Contact with and (suspected) exposure to covid-19: Secondary | ICD-10-CM

## 2019-02-01 LAB — NOVEL CORONAVIRUS, NAA: SARS-CoV-2, NAA: NOT DETECTED

## 2019-02-09 ENCOUNTER — Telehealth: Payer: Self-pay | Admitting: Family Medicine

## 2019-02-09 NOTE — Telephone Encounter (Signed)
Patient called and received her results

## 2019-03-19 ENCOUNTER — Ambulatory Visit: Payer: Self-pay | Admitting: *Deleted

## 2019-03-19 NOTE — Telephone Encounter (Signed)
Pt called in c/o having a headache on the left side of her head that comes and goes since early May.   Today she experienced over her left eye a "scale like film" over her eye making her vision a little blurry.  She denies any weakness/numbness/tinging on one side of her body, speech is clear and appropriate.   Denies any accidents or bumps to the head.   She is also having a pinching pain in her left breast that started about the same time as the headaches.  See triage notes.  I warm transferred her call into Dr. Hulen Shouts office to Judson Roch who is going to schedule her for today if she can come it. Reason for Disposition . [1] New headache AND [2] age > 70  Answer Assessment - Initial Assessment Questions 1. LOCATION: "Where does it hurt?"      My head hurts on the left side since early May.   Now it's worse.   Before the pain would go away and come back. My vision is impaired.   It's spotty.    I turned off the lights which helped some.   I'm having a pinching sensation in my left breast.   2. ONSET: "When did the headache start?" (Minutes, hours or days)      Since early May. 3. PATTERN: "Does the pain come and go, or has it been constant since it started?"     Come and go but getting worse lately. 4. SEVERITY: "How bad is the pain?" and "What does it keep you from doing?"  (e.g., Scale 1-10; mild, moderate, or severe)   - MILD (1-3): doesn't interfere with normal activities    - MODERATE (4-7): interferes with normal activities or awakens from sleep    - SEVERE (8-10): excruciating pain, unable to do any normal activities        Varies but 5-7 on scale. 5. RECURRENT SYMPTOM: "Have you ever had headaches before?" If so, ask: "When was the last time?" and "What happened that time?"      Many years ago as a teenager I would get headaches in the summer time. 6. CAUSE: "What do you think is causing the headache?"     I don't know.   I saw the eye doctor last year and everything was fine. 7.  MIGRAINE: "Have you been diagnosed with migraine headaches?" If so, ask: "Is this headache similar?"      No 8. 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    9. OTHER SYMPTOMS: "Do you have any other symptoms?" (fever, stiff neck, eye pain, sore throat, cold symptoms)     Spotty vision.  Not floaters.  The vision was like I had a scale over my left eye.   The scale cloudiness is better with the lights off.  I'm having a pinching feeling in my left breast.   It's not bad.   I feel it 3-4 times a week.   It doesn't prevent me from sleeping or anything.    I've had several biopsies.   I saw Dr. Excell Seltzer for it.  I can't remember which breast it was.  They were benign.   My last mammogram was in Oct.  10. PREGNANCY: "Is there any chance you are pregnant?" "When was your last menstrual period?"       N/A due to age  Protocols used: HEADACHE-A-AH

## 2019-03-19 NOTE — Telephone Encounter (Signed)
FYI - I spoke with patient. She wanted to to wait until you were on Monday to be seen. She is scheduled for 3:20 on Monday and already prescreened.

## 2019-03-21 NOTE — Progress Notes (Signed)
Subjective:   Patient ID: Katherine Harris, female    DOB: 25-Aug-1960, 58 y.o.   MRN: [id number]  Katherine Harris is a pleasant 58 y.o. year old female who presents to clinic today with Headache (Pt is here today after being triaged on 8.28.2020 for a left sided headache with vision changes. Today she is not experiencing any pain but over the last 3 months she has had quite a few H/A's and Migraines. Always left sided frontal temporal and top of head. Light-sensitivity only and denies N&V.)  on 03/22/2019  HPI:  Received the following note from nurse triage:      "Pt called in c/o having a headache on the left side of her head that comes and goes since early May.   Today she experienced over her left eye a "scale like film" over her eye making her vision a little blurry.  She denies any weakness/numbness/tinging on one side of her body, speech is clear and appropriate.   Denies any accidents or bumps to the head."      Over the last 3 months, she is having more frequent headaches.  Now they are occurring daily.  Naproxen helps but she won't take it daily.  (   Headaches are different- I asked her to keep a headache journal which she did not.  They are always on the left front or temporal side.  Sometimes associated with sensitivity to light, without nausea or vomiting.  Every now and then she has visual changes"like a scale over left eye."  Over the last 3 months, she has been under more stress.  She is not sure about triggers in terms of food.  She hasn't been sleeping well either.  Melatonin is no longer working.  Current Outpatient Medications on File Prior to Visit  Medication Sig Dispense Refill  . Magnesium 100 MG TABS Take by mouth.    . Melatonin 5 MG TABS Take by mouth.    . naproxen (NAPROSYN) 500 MG tablet Take 500 mg by mouth.     No current facility-administered medications on file prior to visit.     No Known Allergies  Past Medical History:  Diagnosis Date  .  Anemia    recently taking otc iron supplement per pt.  . Arthritis    right foot pain - otc med, knees   . Breast cyst   . Fibroids   . Fibroids   . GERD (gastroesophageal reflux disease)   . Heavy menstrual bleeding   . HSV (herpes simplex virus) infection   . HSV infection    TYPE 2  . Obesity   . Reflux    very mild per pt. - no meds    Past Surgical History:  Procedure Laterality Date  .  esophageal  stretching  09/2010  . ABDOMINAL HYSTERECTOMY    . BREAST EXCISIONAL BIOPSY     bilateral  . BREAST LUMPECTOMY Right 12/06/2013   Procedure: RIGHT BREAST LUMPECTOMY;  Surgeon: Edward Jolly, MD;  Location: [facility];  Service: General;  Laterality: Right;  . CESAREAN SECTION  07/22/1990  . CYSTOSCOPY  08/26/2011   Procedure: CYSTOSCOPY;  Surgeon: Myra C. Hulan Fray, MD;  Location: [facility];  Service: Gynecology;  Laterality: N/A;  . missed abortion    . SALPINGOOPHORECTOMY  08/26/2011   Procedure: SALPINGO OOPHERECTOMY;  Surgeon: Myra C. Hulan Fray, MD;  Location: [facility];  Service: Gynecology;  Laterality: Bilateral;  . SVD  1997   x  1  . UPPER GASTROINTESTINAL ENDOSCOPY    . WISDOM TOOTH EXTRACTION      Family History  Problem Relation Age of Onset  . Hypertension Mother   . Liver cancer Mother   . Cancer Mother 31       Liver Cancer  . Leukemia Father   . Liver cancer Maternal Aunt 38       Breast Cancer one breast  . Breast cancer Maternal Aunt   . Cancer Paternal Grandmother 71       Breast Cancer  . Breast cancer Paternal Grandmother   . Cancer Cousin 37       Brain Cancer  . Colon cancer Neg Hx   . Stomach cancer Neg Hx     Social History   Socioeconomic History  . Marital status: Single    Spouse name: Not on file  . Number of children: Not on file  . Years of education: Not on file  . Highest education level: Not on file  Occupational History  . Occupation: Product manager: [employer]  . Financial resource  strain: Not on file  . Food insecurity    Worry: Not on file    Inability: Not on file  . Transportation needs    Medical: Not on file    Non-medical: Not on file  Tobacco Use  . Smoking status: Never Smoker  . Smokeless tobacco: Never Used  Substance and Sexual Activity  . Alcohol use: No  . Drug use: No  . Sexual activity: Not Currently    Birth control/protection: None  Lifestyle  . Physical activity    Days per week: Not on file    Minutes per session: Not on file  . Stress: Not on file  Relationships  . Social Herbalist on phone: Not on file    Gets together: Not on file    Attends religious service: Not on file    Active member of club or organization: Not on file    Attends meetings of clubs or organizations: Not on file    Relationship status: Not on file  . Intimate partner violence    Fear of current or ex partner: Not on file    Emotionally abused: Not on file    Physically abused: Not on file    Forced sexual activity: Not on file  Other Topics Concern  . Not on file  Social History Narrative  . Not on file   The PMH, PSH, Social History, Family History, Medications, and allergies have been reviewed in [facility], and have been updated if relevant.   Review of Systems     Objective:    BP 118/66 (BP Location: Left Arm, Patient Position: Sitting, Cuff Size: Normal)   Pulse 86   Temp 99 F (37.2 C) (Oral)   Ht 5\' 2"  (1.575 m)   Wt 236 lb 6.4 oz (107.2 kg)   LMP 06/10/2011   SpO2 97%   BMI 43.24 kg/m    Physical Exam        Assessment & Plan:   No diagnosis found. No follow-ups on file.

## 2019-03-22 ENCOUNTER — Other Ambulatory Visit: Payer: Self-pay

## 2019-03-22 ENCOUNTER — Encounter: Payer: Self-pay | Admitting: Family Medicine

## 2019-03-22 ENCOUNTER — Ambulatory Visit: Payer: BC Managed Care – PPO | Admitting: Family Medicine

## 2019-03-22 VITALS — BP 118/66 | HR 86 | Temp 99.0°F | Ht 62.0 in | Wt 236.4 lb

## 2019-03-22 DIAGNOSIS — R51 Headache: Secondary | ICD-10-CM | POA: Diagnosis not present

## 2019-03-22 DIAGNOSIS — G4700 Insomnia, unspecified: Secondary | ICD-10-CM | POA: Diagnosis not present

## 2019-03-22 DIAGNOSIS — G8929 Other chronic pain: Secondary | ICD-10-CM

## 2019-03-22 MED ORDER — SUMATRIPTAN SUCCINATE 25 MG PO TABS: 25.0000 mg | ORAL_TABLET | ORAL | 0 refills | Status: DC | PRN

## 2019-03-22 MED ORDER — TRAZODONE HCL 50 MG PO TABS: 25.0000 mg | ORAL_TABLET | Freq: Every evening | ORAL | 3 refills | Status: DC | PRN

## 2019-03-22 NOTE — Assessment & Plan Note (Signed)
  The problem of recurrent insomnia is discussed. Avoidance of caffeine sources is strongly encouraged. Sleep hygiene issues are reviewed.  She agreed to start trazodone 25-50 mg qhs prn sleep. Okay to stop melatonin. Call or send my chart message prn if these symptoms worsen or fail to improve as anticipated. The patient indicates understanding of these issues and agrees with the plan.

## 2019-03-22 NOTE — Patient Instructions (Addendum)
Great to see you. Please restart a headache journal.  Take the imitrex only for the headaches associated with visual changes and or sensitivity to sound and light.  We are referring you to a neurologist.   Please let me know if you haven't heard from them in 1- 2 weeks.  We are restarting trazodone at bedtime for insomnia.   Migraine Headache A migraine headache is an intense, throbbing pain on one side or both sides of the head. Migraine headaches may also cause other symptoms, such as nausea, vomiting, and sensitivity to light and noise. A migraine headache can last from 4 hours to 3 days. Talk with your doctor about what things may bring on (trigger) your migraine headaches. What are the causes? The exact cause of this condition is not known. However, a migraine may be caused when nerves in the brain become irritated and release chemicals that cause inflammation of blood vessels. This inflammation causes pain. This condition may be triggered or caused by:  Drinking alcohol.  Smoking.  Taking medicines, such as: ? Medicine used to treat chest pain (nitroglycerin). ? Birth control pills. ? Estrogen. ? Certain blood pressure medicines.  Eating or drinking products that contain nitrates, glutamate, aspartame, or tyramine. Aged cheeses, chocolate, or caffeine may also be triggers.  Doing physical activity. Other things that may trigger a migraine headache include:  Menstruation.  Pregnancy.  Hunger.  Stress.  Lack of sleep or too much sleep.  Weather changes.  Fatigue. What increases the risk? The following factors may make you more likely to experience migraine headaches:  Being a certain age. This condition is more common in people who are 86-37 years old.  Being female.  Having a family history of migraine headaches.  Being Caucasian.  Having a mental health condition, such as depression or anxiety.  Being obese. What are the signs or symptoms? The main  symptom of this condition is pulsating or throbbing pain. This pain may:  Happen in any area of the head, such as on one side or both sides.  Interfere with daily activities.  Get worse with physical activity.  Get worse with exposure to bright lights or loud noises. Other symptoms may include:  Nausea.  Vomiting.  Dizziness.  General sensitivity to bright lights, loud noises, or smells. Before you get a migraine headache, you may get warning signs (an aura). An aura may include:  Seeing flashing lights or having blind spots.  Seeing bright spots, halos, or zigzag lines.  Having tunnel vision or blurred vision.  Having numbness or a tingling feeling.  Having trouble talking.  Having muscle weakness. Some people have symptoms after a migraine headache (postdromal phase), such as:  Feeling tired.  Difficulty concentrating. How is this diagnosed? A migraine headache can be diagnosed based on:  Your symptoms.  A physical exam.  Tests, such as: ? CT scan or an MRI of the head. These imaging tests can help rule out other causes of headaches. ? Taking fluid from the spine (lumbar puncture) and analyzing it (cerebrospinal fluid analysis, or CSF analysis). How is this treated? This condition may be treated with medicines that:  Relieve pain.  Relieve nausea.  Prevent migraine headaches. Treatment for this condition may also include:  Acupuncture.  Lifestyle changes like avoiding foods that trigger migraine headaches.  Biofeedback.  Cognitive behavioral therapy. Follow these instructions at home: Medicines  Take over-the-counter and prescription medicines only as told by your health care provider.  Ask your health care  provider if the medicine prescribed to you: ? Requires you to avoid driving or using heavy machinery. ? Can cause constipation. You may need to take these actions to prevent or treat constipation:  Drink enough fluid to keep your urine pale  yellow.  Take over-the-counter or prescription medicines.  Eat foods that are high in fiber, such as beans, whole grains, and fresh fruits and vegetables.  Limit foods that are high in fat and processed sugars, such as fried or sweet foods. Lifestyle  Do not drink alcohol.  Do not use any products that contain nicotine or tobacco, such as cigarettes, e-cigarettes, and chewing tobacco. If you need help quitting, ask your health care provider.  Get at least 8 hours of sleep every night.  Find ways to manage stress, such as meditation, deep breathing, or yoga. General instructions      Keep a journal to find out what may trigger your migraine headaches. For example, write down: ? What you eat and drink. ? How much sleep you get. ? Any change to your diet or medicines.  If you have a migraine headache: ? Avoid things that make your symptoms worse, such as bright lights. ? It may help to lie down in a dark, quiet room. ? Do not drive or use heavy machinery. ? Ask your health care provider what activities are safe for you while you are experiencing symptoms.  Keep all follow-up visits as told by your health care provider. This is important. Contact a health care provider if:  You develop symptoms that are different or more severe than your usual migraine headache symptoms.  You have more than 15 headache days in one month. Get help right away if:  Your migraine headache becomes severe.  Your migraine headache lasts longer than 72 hours.  You have a fever.  You have a stiff neck.  You have vision loss.  Your muscles feel weak or like you cannot control them.  You start to lose your balance often.  You have trouble walking.  You faint.  You have a seizure. Summary  A migraine headache is an intense, throbbing pain on one side or both sides of the head. Migraines may also cause other symptoms, such as nausea, vomiting, and sensitivity to light and noise.  This  condition may be treated with medicines and lifestyle changes. You may also need to avoid certain things that trigger a migraine headache.  Keep a journal to find out what may trigger your migraine headaches.  Contact your health care provider if you have more than 15 headache days in a month or you develop symptoms that are different or more severe than your usual migraine headache symptoms. This information is not intended to replace advice given to you by your health care provider. Make sure you discuss any questions you have with your health care provider. Document Released: 07/08/2005 Document Revised: 10/30/2018 Document Reviewed: 08/20/2018 Elsevier Patient Education  2020 [employer].

## 2019-03-22 NOTE — Assessment & Plan Note (Signed)
>  40 minutes spent in face to face time with patient, >50% spent in counselling or coordination of care discussing headaches and insomnia. She is likely having more than one type of headache- medication overuse, tension, along with some migraines.  Discussed at length that the migraines would be the ones associated with sensitivity to light, sound and or visual changes.  eRx sent for imitrex to be taken only for those headaches.  Strongly urged her to keep a HA journal as both stress and lack of sleep are triggers for migraines, as are certain foods.  Also refer to neurology. Start trazodone 25-50 mg qhs prn insomnia and she will update me with any concerns and within 2-3 weeks. The patient indicates understanding of these issues and agrees with the plan.

## 2019-05-13 ENCOUNTER — Other Ambulatory Visit: Payer: Self-pay

## 2019-05-13 DIAGNOSIS — Z20822 Contact with and (suspected) exposure to covid-19: Secondary | ICD-10-CM

## 2019-05-15 LAB — NOVEL CORONAVIRUS, NAA: SARS-CoV-2, NAA: NOT DETECTED

## 2019-08-05 NOTE — Progress Notes (Signed)
Subjective:    Patient ID: Katherine Harris, female    DOB: 1960-10-21, 59 y.o.   MRN: [id number]  Chief Complaint  Patient presents with  . Breast Problem    HPI Patient is in today for : Left breast pain. Last mammogram was on 3.23.20 which showed a small cluster of cysts @ 9 O'Clock. Was set up for surgical consult with Dr. Thurmond Butts at [facility] for 4.17.2020. Breast pain is in same breast and states it feels like a burning or pinching sensation. This sensation has been going on for 8-9 months. Denies any nipple inversion, dimpling or discharge from either breast.  States that there is a pea sized questionable area at 12 O'clock on right breast that started within the last 3 months.      Past Medical History:  Diagnosis Date  . Anemia    recently taking otc iron supplement per pt.  . Arthritis    right foot pain - otc med, knees   . Breast cyst   . Fibroids   . Fibroids   . GERD (gastroesophageal reflux disease)   . Heavy menstrual bleeding   . HSV (herpes simplex virus) infection   . HSV infection    TYPE 2  . Obesity   . Reflux    very mild per pt. - no meds    Past Surgical History:  Procedure Laterality Date  .  esophageal  stretching  09/2010  . ABDOMINAL HYSTERECTOMY    . BREAST EXCISIONAL BIOPSY     bilateral  . BREAST LUMPECTOMY Right 12/06/2013   Procedure: RIGHT BREAST LUMPECTOMY;  Surgeon: Edward Jolly, MD;  Location: [facility];  Service: General;  Laterality: Right;  . CESAREAN SECTION  07/22/1990  . CYSTOSCOPY  08/26/2011   Procedure: CYSTOSCOPY;  Surgeon: Myra C. Hulan Fray, MD;  Location: [facility];  Service: Gynecology;  Laterality: N/A;  . missed abortion    . SALPINGOOPHORECTOMY  08/26/2011   Procedure: SALPINGO OOPHERECTOMY;  Surgeon: Myra C. Hulan Fray, MD;  Location: [facility];  Service: Gynecology;  Laterality: Bilateral;  . SVD  1997   x 1  . UPPER GASTROINTESTINAL ENDOSCOPY    . WISDOM TOOTH EXTRACTION      Family  History  Problem Relation Age of Onset  . Hypertension Mother   . Liver cancer Mother   . Cancer Mother 33       Liver Cancer  . Leukemia Father   . Liver cancer Maternal Aunt 38       Breast Cancer one breast  . Breast cancer Maternal Aunt   . Cancer Paternal Grandmother 45       Breast Cancer  . Breast cancer Paternal Grandmother   . Cancer Cousin 68       Brain Cancer  . Colon cancer Neg Hx   . Stomach cancer Neg Hx     Social History   Socioeconomic History  . Marital status: Single    Spouse name: Not on file  . Number of children: Not on file  . Years of education: Not on file  . Highest education level: Not on file  Occupational History  . Occupation: Product manager: [employer]  Tobacco Use  . Smoking status: Never Smoker  . Smokeless tobacco: Never Used  Substance and Sexual Activity  . Alcohol use: No  . Drug use: No  . Sexual activity: Not Currently    Birth control/protection: None  Other  Topics Concern  . Not on file  Social History Narrative  . Not on file   Social Determinants of Health   Financial Resource Strain:   . Difficulty of Paying Living Expenses: Not on file  Food Insecurity:   . Worried About Charity fundraiser in the Last Year: Not on file  . Ran Out of Food in the Last Year: Not on file  Transportation Needs:   . Lack of Transportation (Medical): Not on file  . Lack of Transportation (Non-Medical): Not on file  Physical Activity:   . Days of Exercise per Week: Not on file  . Minutes of Exercise per Session: Not on file  Stress:   . Feeling of Stress : Not on file  Social Connections:   . Frequency of Communication with Friends and Family: Not on file  . Frequency of Social Gatherings with Friends and Family: Not on file  . Attends Religious Services: Not on file  . Active Member of Clubs or Organizations: Not on file  . Attends Archivist Meetings: Not on file  . Marital Status: Not on file   Intimate Partner Violence:   . Fear of Current or Ex-Partner: Not on file  . Emotionally Abused: Not on file  . Physically Abused: Not on file  . Sexually Abused: Not on file    Outpatient Medications Prior to Visit  Medication Sig Dispense Refill  . Melatonin 5 MG TABS Take by mouth.    . naproxen (NAPROSYN) 500 MG tablet Take 500 mg by mouth.    . traZODone (DESYREL) 50 MG tablet Take 0.5-1 tablets (25-50 mg total) by mouth at bedtime as needed for sleep. 30 tablet 3  . Magnesium 100 MG TABS Take by mouth.    . SUMAtriptan (IMITREX) 25 MG tablet Take 1 tablet (25 mg total) by mouth every 2 (two) hours as needed for migraine. May repeat in 2 hours if headache persists or recurs. (Patient not taking: Reported on 08/09/2019) 10 tablet 0   No facility-administered medications prior to visit.    No Known Allergies  Review of Systems  Constitutional: Negative for fever and malaise/fatigue.  HENT: Negative for congestion and hearing loss.   Eyes: Negative for blurred vision, discharge and redness.  Respiratory: Negative for cough and shortness of breath.   Cardiovascular: Negative for chest pain, palpitations, claudication and leg swelling.  Gastrointestinal: Negative for abdominal pain and heartburn.  Genitourinary: Negative for dysuria.  Musculoskeletal: Negative for falls.  Skin: Negative for rash.       + left breast mass, right breast mass for several months- see problem based charting   Neurological: Negative for loss of consciousness and headaches.  Endo/Heme/Allergies: Does not bruise/bleed easily.  Psychiatric/Behavioral: Negative for depression and memory loss.  All other systems reviewed and are negative.      Objective:    Physical Exam Vitals and nursing note reviewed.  Constitutional:      Appearance: Normal appearance. She is not ill-appearing.  HENT:     Head: Normocephalic and atraumatic.     Right Ear: External ear normal.     Left Ear: External ear  normal.     Nose: Nose normal.  Eyes:     General:        Right eye: No discharge.        Left eye: No discharge.  Cardiovascular:     Rate and Rhythm: Normal rate and regular rhythm.     Heart sounds: Normal  heart sounds.  Pulmonary:     Effort: Pulmonary effort is normal.     Breath sounds: No wheezing.  Chest:     Breasts:        Right: Tenderness present. No swelling, bleeding, inverted nipple, nipple discharge or skin change.        Left: Mass present. No swelling, bleeding, inverted nipple, nipple discharge, skin change or tenderness.  Abdominal:     Palpations: Abdomen is soft. There is no mass.     Tenderness: There is no abdominal tenderness. There is no guarding.  Musculoskeletal:        General: Normal range of motion.     Right lower leg: No edema.     Left lower leg: No edema.  Skin:    General: Skin is dry.  Neurological:     Mental Status: She is alert and oriented to person, place, and time.     Deep Tendon Reflexes: Reflexes normal.  Psychiatric:        Mood and Affect: Mood normal.        Behavior: Behavior normal.     BP 134/88 (BP Location: Left Arm, Patient Position: Sitting, Cuff Size: Normal)   Pulse 80   Temp 98.3 F (36.8 C) (Oral)   Wt 237 lb 9.6 oz (107.8 kg)   LMP 06/10/2011   SpO2 99%   BMI 43.46 kg/m  Wt Readings from Last 3 Encounters:  08/09/19 237 lb 9.6 oz (107.8 kg)  03/22/19 236 lb 6.4 oz (107.2 kg)  09/11/18 232 lb (105.2 kg)     Lab Results  Component Value Date   WBC 4.8 09/11/2018   HGB 14.1 09/11/2018   HCT 43.1 09/11/2018   PLT 189 09/11/2018   GLUCOSE 92 09/11/2018   CHOL 188 07/13/2014   TRIG 80.0 07/13/2014   HDL 42.60 07/13/2014   LDLCALC 129 (H) 07/13/2014   ALT 8 09/11/2018   AST 15 09/11/2018   NA 138 09/11/2018   K 4.2 09/11/2018   CL 104 09/11/2018   CREATININE 0.85 09/11/2018   BUN 15 09/11/2018   CO2 27 09/11/2018   TSH 1.73 07/13/2014    Lab Results  Component Value Date   TSH 1.73  07/13/2014   Lab Results  Component Value Date   WBC 4.8 09/11/2018   HGB 14.1 09/11/2018   HCT 43.1 09/11/2018   MCV 90.0 09/11/2018   PLT 189 09/11/2018   Lab Results  Component Value Date   NA 138 09/11/2018   K 4.2 09/11/2018   CO2 27 09/11/2018   GLUCOSE 92 09/11/2018   BUN 15 09/11/2018   CREATININE 0.85 09/11/2018   BILITOT 0.4 09/11/2018   ALKPHOS 67 07/13/2014   AST 15 09/11/2018   ALT 8 09/11/2018   PROT 7.2 09/11/2018   ALBUMIN 4.2 07/13/2014   CALCIUM 9.6 09/11/2018   GFR 97.20 07/13/2014   Lab Results  Component Value Date   CHOL 188 07/13/2014   Lab Results  Component Value Date   HDL 42.60 07/13/2014   Lab Results  Component Value Date   LDLCALC 129 (H) 07/13/2014   Lab Results  Component Value Date   TRIG 80.0 07/13/2014   Lab Results  Component Value Date   CHOLHDL 4 07/13/2014   No results found for: HGBA1C     Assessment & Plan:   Problem List Items Addressed This Visit      Active Problems   Breast pain, left    Left breast  pain.   Last mammogram was on 3.23.20 which showed a small cluster of cysts @ 9 O'Clock. Was set up for surgical consult with Dr. Thurmond Butts at [facility] for 4.17.2020. Breast pain is in same breast and states it feels like a burning or pinching sensation. This sensation has been going on for 8-9 months. Denies any nipple inversion, dimpling or discharge from either breast.  States that there is a pea sized questionable area at 12 O'clock on right breast that started within the last 3 months.    Last mammogram and ultrasound (of right breast, not left) was done in 09/2018 and showed:  RECOMMENDATION: Follow-up surgical consultation is suggested given history of nipple discharge. 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 Bilateral breast MRI could be considered.Our office will attempt to arrange a surgical consultation with Dr. Excell Seltzer for the patient.  I have  discussed the findings and recommendations with the patient. Results were also provided in writing at the conclusion of the visit. If applicable, a reminder letter will be sent to the patient regarding the next appointment.  BI-RADS CATEGORY  2: Benign.  Electronically Signed: By: Curlene Dolphin M.D. On: 10/12/2018 14:59      Relevant Orders   MM Digital Diagnostic Bilat   US BREAST COMPLETE UNI RIGHT INC AXILLA   US BREAST COMPLETE UNI LEFT INC AXILLA    Other Visit Diagnoses    Breast mass, right    -  Primary   Relevant Orders   MM Digital Diagnostic Bilat   US BREAST COMPLETE UNI RIGHT INC AXILLA      I am having Laurelyn Sickle maintain her naproxen, Melatonin, Magnesium, SUMAtriptan, and traZODone.  No orders of the defined types were placed in this encounter.   This visit occurred during the SARS-CoV-2 public health emergency.  Safety protocols were in place, including screening questions prior to the visit, additional usage of staff PPE, and extensive cleaning of exam room while observing appropriate contact time as indicated for disinfecting solutions.    Arnette Norris, MD

## 2019-08-08 ENCOUNTER — Encounter: Payer: Self-pay | Admitting: Family Medicine

## 2019-08-08 DIAGNOSIS — N644 Mastodynia: Secondary | ICD-10-CM | POA: Insufficient documentation

## 2019-08-08 NOTE — Assessment & Plan Note (Addendum)
Left breast pain.   Last mammogram was on 3.23.20 which showed a small cluster of cysts @ 9 O'Clock. Was set up for surgical consult with Dr. Thurmond Butts at [facility] for 4.17.2020.  She couldn't go due to covid.     Breast pain is in same breast and states it feels like a burning or pinching sensation. This sensation has been going on for 8-9 months. Denies any nipple inversion, dimpling or discharge from either breast.  States that there is a pea sized questionable area at 12 O'clock on right breast that started within the last 3 months.    Last mammogram and ultrasound (of right breast, not left) was done in 09/2018 and showed:  RECOMMENDATION: Follow-up surgical consultation is suggested given history of nipple discharge. 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 Bilateral breast MRI could be considered.Our office will attempt to arrange a surgical consultation with Dr. Excell Seltzer for the patient.  I have discussed the findings and recommendations with the patient. Results were also provided in writing at the conclusion of the visit. If applicable, a reminder letter will be sent to the patient regarding the next appointment.  BI-RADS CATEGORY  2: Benign.  Electronically Signed: By: Curlene Dolphin M.D. On: 10/12/2018 14:59

## 2019-08-09 ENCOUNTER — Other Ambulatory Visit: Payer: Self-pay

## 2019-08-09 ENCOUNTER — Encounter: Payer: Self-pay | Admitting: Family Medicine

## 2019-08-09 ENCOUNTER — Ambulatory Visit: Payer: BC Managed Care – PPO | Admitting: Family Medicine

## 2019-08-09 VITALS — BP 134/88 | HR 80 | Temp 98.3°F | Wt 237.6 lb

## 2019-08-09 DIAGNOSIS — N644 Mastodynia: Secondary | ICD-10-CM

## 2019-08-09 DIAGNOSIS — N6310 Unspecified lump in the right breast, unspecified quadrant: Secondary | ICD-10-CM | POA: Insufficient documentation

## 2019-08-09 NOTE — Patient Instructions (Addendum)
Please call [facility] to schedule your imaging at [phone] :)

## 2019-08-09 NOTE — Assessment & Plan Note (Addendum)
See below. Prefers to do re imaging prior to referring to surgery again. Orders Placed This Encounter  Procedures  . MM Digital Diagnostic Bilat  . US BREAST COMPLETE UNI RIGHT INC AXILLA  . US BREAST COMPLETE UNI LEFT INC AXILLA

## 2019-08-10 ENCOUNTER — Telehealth: Payer: Self-pay | Admitting: Behavioral Health

## 2019-08-10 NOTE — Telephone Encounter (Signed)
Attempted to reach patient regarding the below concern; no answer at the time of call. Voicemail unavailable. Will try calling patient again at a later time.

## 2019-08-10 NOTE — Telephone Encounter (Signed)
No I do not think she needs to be tested for covid based on interaction with Selinda Eon.  She was wearing mask, eye wear and has been asymptomatic for the appropriate period of time.  Also, there is no point in getting tested prior to at least 5 days post contact with someone if her concern is her contact with Sharyn Lull.  However, if she does want to get test:      The COVID-19 Community Testing sites will begin testing BY APPOINTMENT ONLY starting the week of December 14th (see go-live dates by location below). . In addition, starting the week of December 14th we will move INDOORS for testing, (see locations Below).  . Community testing site appointment hours will be 8 a.m. to 3:30 p.m.  If you/your practice has a patient that needs an appointment, please have the patient text "COVID" to [phone], OR they can log on to [email or web address] to easily make an on-line appointment.  If you have a patient who does not have access to a smart phone or PC, you or the patient can call [phone] to get assistance.  If you have a patient that needs a test and appointments are full, please send a message to the [place] or call [phone]. A provider may request that we overbook additional tests per day.  [facility] Location: [address] [place] (old [facility])   [employer] Location: [address] [place], [place] [address]  [place] ([place])   [facility] Location: Optician, dispensing (across from [place] ED)

## 2019-08-10 NOTE — Telephone Encounter (Signed)
[facility] note received via fax  Call Date: 08/10/19 Time: 8:18 AM   Caller: Sharlene Dory Return number: [phone]  Nurse: Valetta Mole, RN  Chief Complaint: Sore Throat  Reason for call: Caller stated she was in the office yesterday and the nurse that took care of her was coughing a lot. She stated she was getting over [place]. Even though both patient and nurse had masks on she is concerned and wants to know if she needs to be tested since she does have a scratchy, sore throat.  Related visit to physician within the last 2 weeks: No  Guideline: Coronavirus (COVID-19) - Diagnosed or Suspected  Disposition: Call PCP when office is open

## 2019-08-10 NOTE — Telephone Encounter (Signed)
Dr. Deborra Medina - Please advise per note below. Thanks.

## 2019-08-13 NOTE — Telephone Encounter (Signed)
Unable to reach patient at time of call x2. Left message for patient to return call when available.

## 2019-08-16 ENCOUNTER — Ambulatory Visit: Payer: BC Managed Care – PPO | Attending: Internal Medicine

## 2019-08-16 DIAGNOSIS — Z20822 Contact with and (suspected) exposure to covid-19: Secondary | ICD-10-CM

## 2019-08-17 LAB — NOVEL CORONAVIRUS, NAA: SARS-CoV-2, NAA: NOT DETECTED

## 2019-08-22 IMAGING — MG DIGITAL SCREENING BILATERAL MAMMOGRAM WITH CAD
3 series · 3 of 3 positions shown · non-contrast
Comparison: Previous exam(s).

ACR Breast Density Category a: The breast tissue is almost entirely
fatty.

CLINICAL DATA: Screening.

EXAM:
DIGITAL SCREENING BILATERAL MAMMOGRAM WITH CAD

[L MLO]
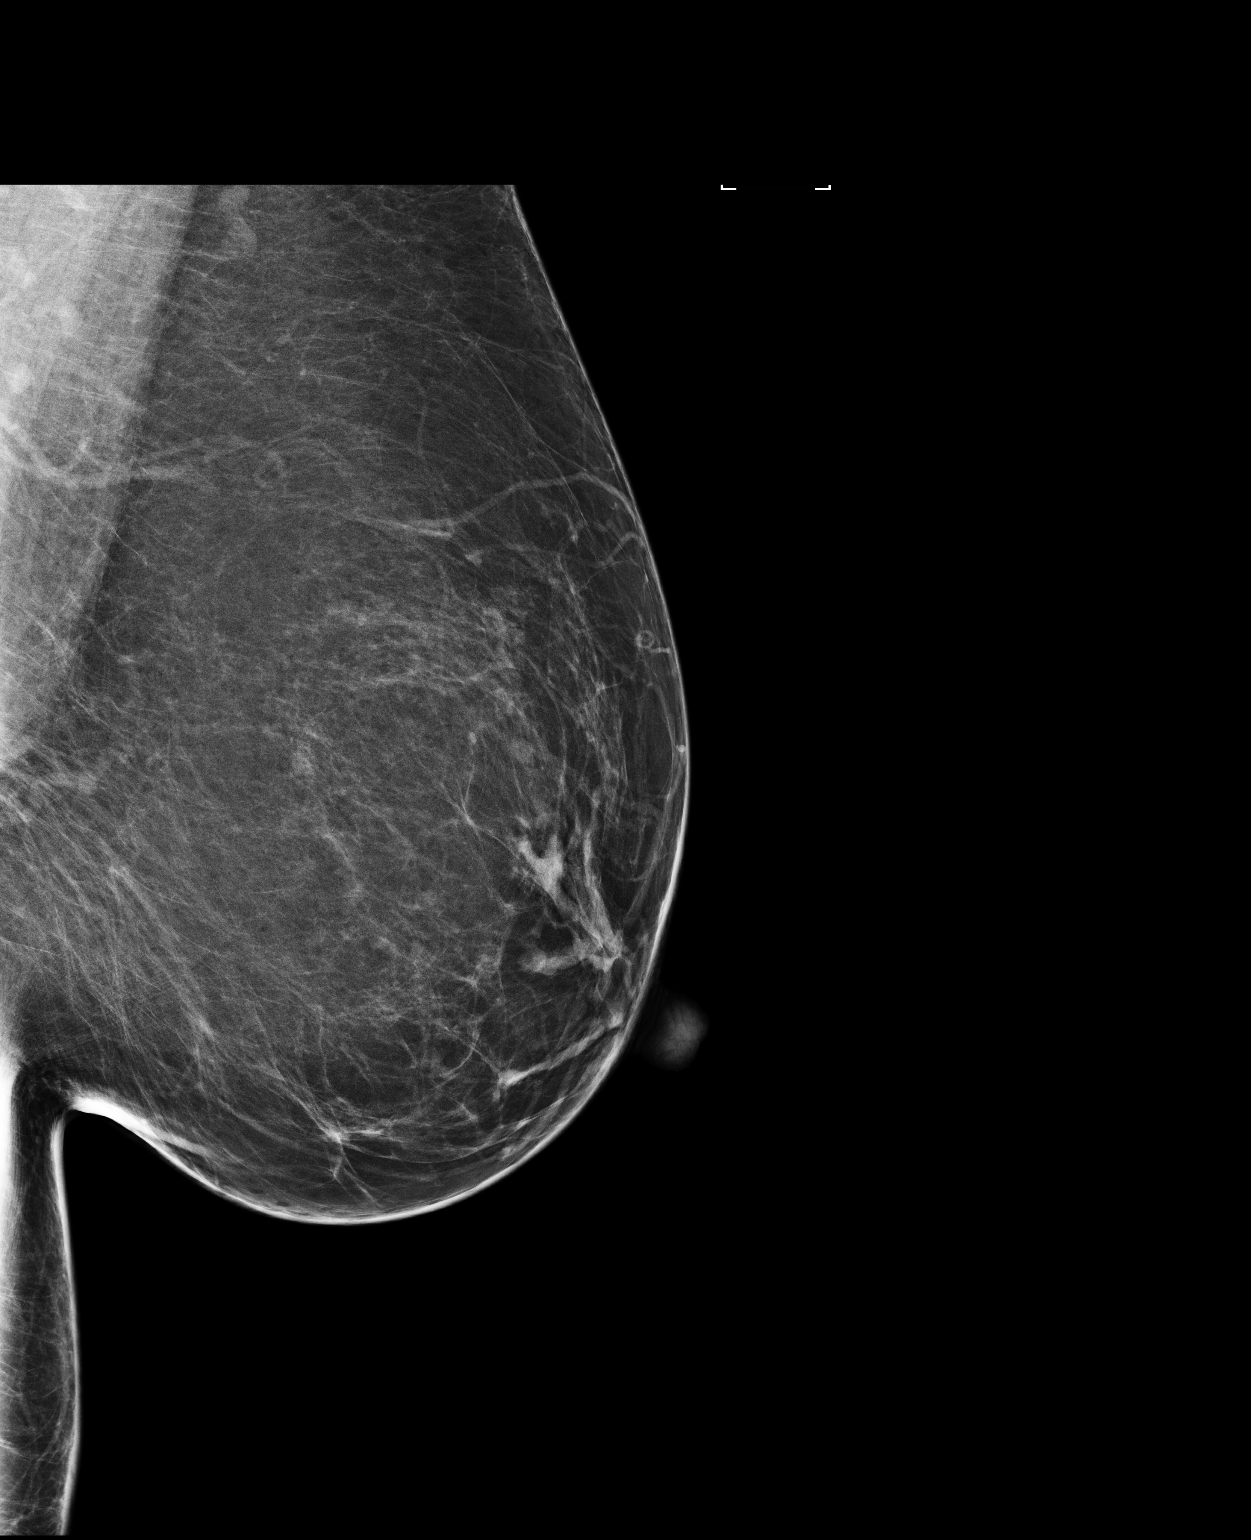

[R CC]
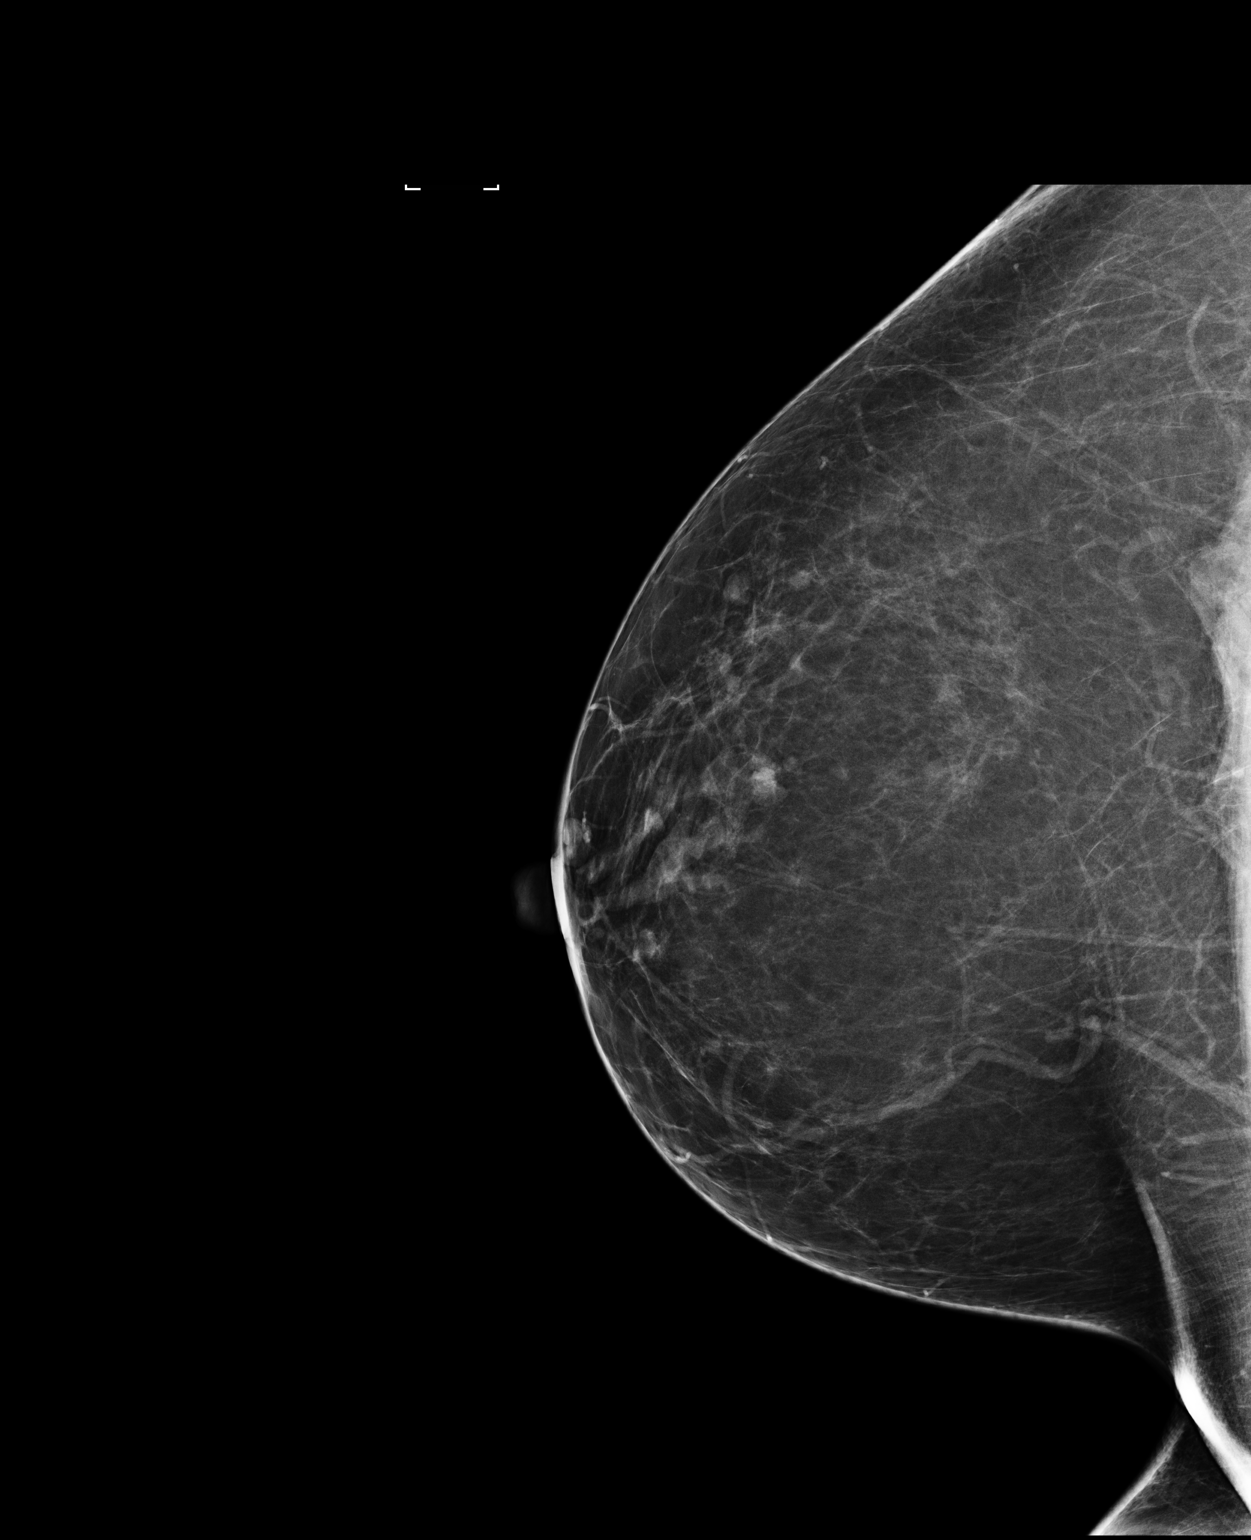

[L CC]
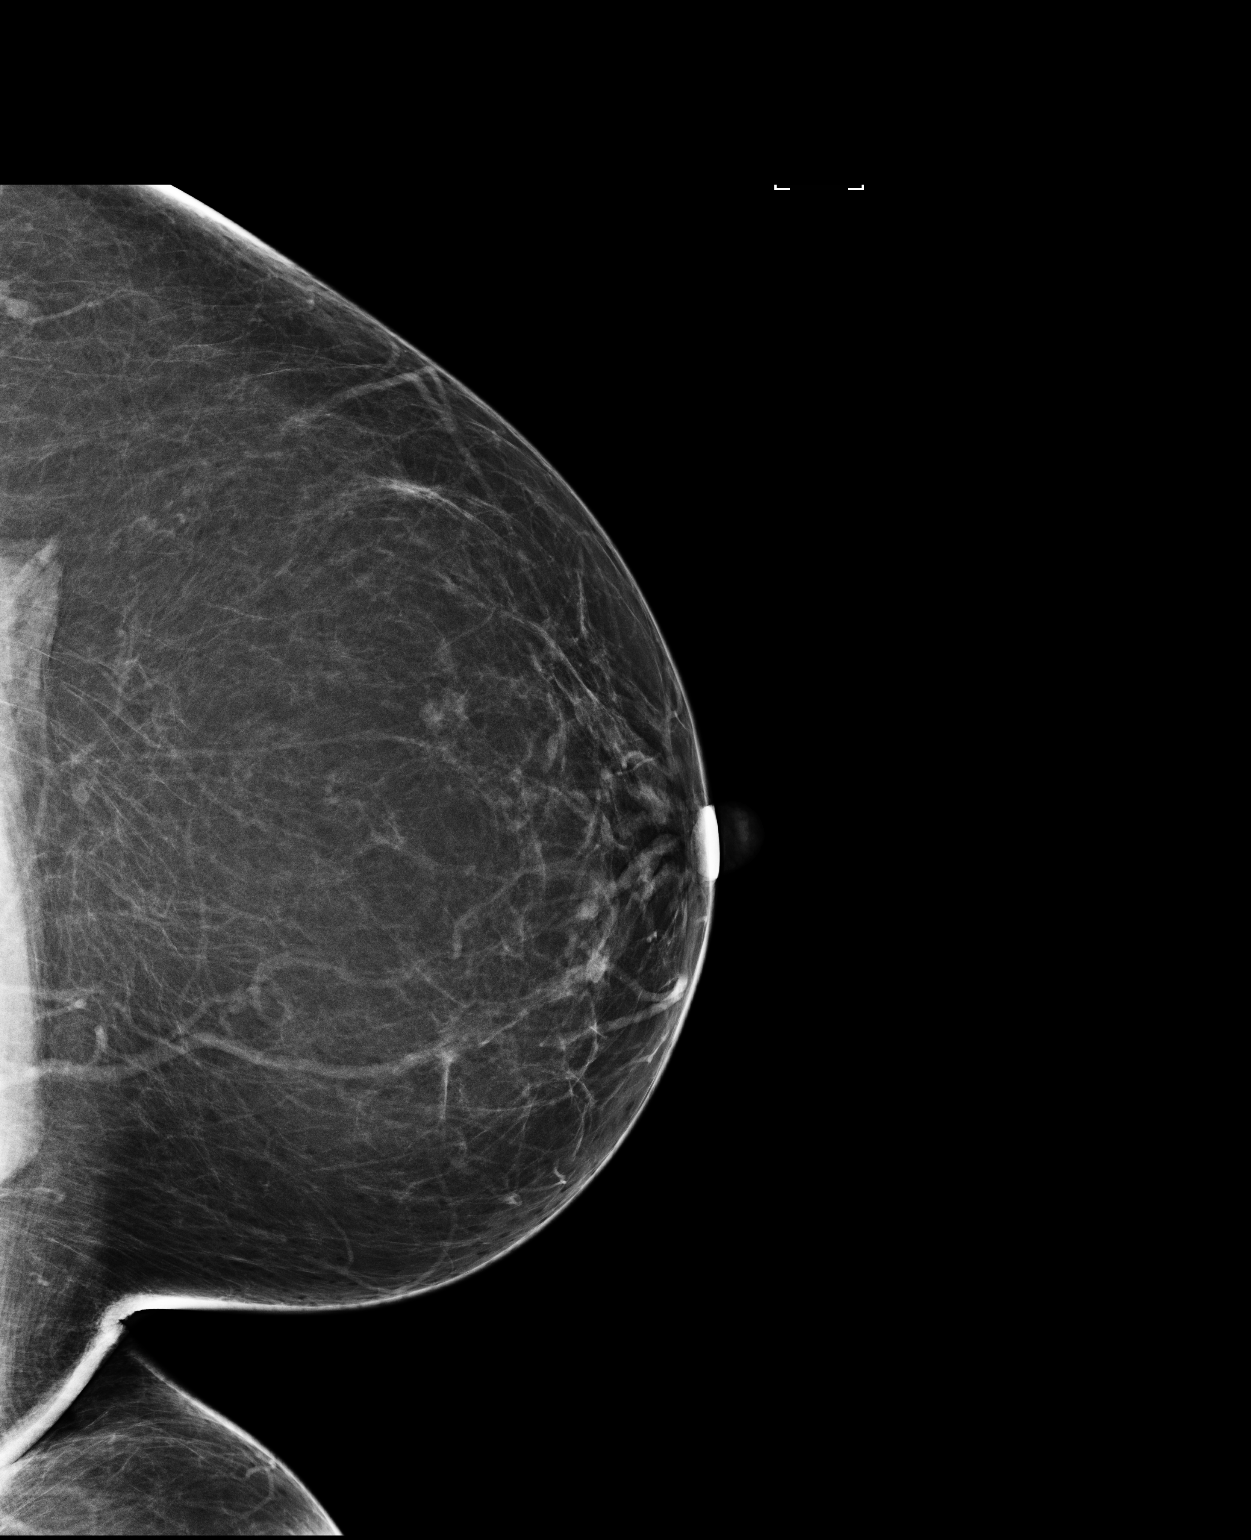

[3 of 3 positions shown; findings below may reference images not displayed]

FINDINGS: There are no findings suspicious for malignancy. Images were
processed with CAD.
IMPRESSION: No mammographic evidence of malignancy. A result letter of this
screening mammogram will be mailed directly to the patient.

RECOMMENDATION:
Screening mammogram in one year. (Code:[id number])

BI-RADS CATEGORY  1: Negative.

## 2019-08-27 ENCOUNTER — Ambulatory Visit
Admission: RE | Admit: 2019-08-27 | Discharge: 2019-08-27 | Disposition: A | Discharge status: Home / Self Care | Payer: BC Managed Care – PPO | Source: Ambulatory Visit | Attending: Family Medicine | Admitting: Family Medicine

## 2019-08-27 ENCOUNTER — Other Ambulatory Visit: Payer: Self-pay

## 2019-08-27 DIAGNOSIS — N644 Mastodynia: Secondary | ICD-10-CM

## 2019-08-27 DIAGNOSIS — N6310 Unspecified lump in the right breast, unspecified quadrant: Secondary | ICD-10-CM

## 2019-09-21 ENCOUNTER — Ambulatory Visit: Payer: BC Managed Care – PPO | Admitting: Podiatry

## 2019-09-21 ENCOUNTER — Other Ambulatory Visit: Payer: Self-pay | Admitting: Podiatry

## 2019-09-21 ENCOUNTER — Other Ambulatory Visit: Payer: Self-pay

## 2019-09-21 ENCOUNTER — Ambulatory Visit (INDEPENDENT_AMBULATORY_CARE_PROVIDER_SITE_OTHER): Payer: BC Managed Care – PPO

## 2019-09-21 DIAGNOSIS — M722 Plantar fascial fibromatosis: Secondary | ICD-10-CM | POA: Diagnosis not present

## 2019-09-21 DIAGNOSIS — M79672 Pain in left foot: Secondary | ICD-10-CM

## 2019-09-22 ENCOUNTER — Encounter: Payer: Self-pay | Admitting: Podiatry

## 2019-09-22 NOTE — Progress Notes (Signed)
Subjective:  Patient ID: Katherine Harris, female    DOB: 1960-09-28,  MRN: [id number]  Chief Complaint  Patient presents with  . Foot Pain    pt is here for left back of heel pain, pt states that pain is elevated when applying pressure, pain has also been going on for 3 months.    59 y.o. female presents with the above complaint.  Patient presents with left heel pain.  Patient states been on for about 3 months.  Has progressively gotten worse.  The pain is now starting to hard in the arches of the foot.  Patient states there is burning sensation.  Patient has tried icing and heating elevation as well.  Pain scale is 10 out of 10.  She has not tried anything at home.  She states that she has not seen anyone else for this either.  Her symptoms are consistent with Po static dyskinesia   Review of Systems: Negative except as noted in the HPI. Denies N/V/F/Ch.  Past Medical History:  Diagnosis Date  . Anemia    recently taking otc iron supplement per pt.  . Arthritis    right foot pain - otc med, knees   . Breast cyst   . Fibroids   . Fibroids   . GERD (gastroesophageal reflux disease)   . Heavy menstrual bleeding   . HSV (herpes simplex virus) infection   . HSV infection    TYPE 2  . Obesity   . Reflux    very mild per pt. - no meds    Current Outpatient Medications:  Marland Kitchen  Magnesium 100 MG TABS, Take by mouth., Disp: , Rfl:  .  Melatonin 5 MG TABS, Take by mouth., Disp: , Rfl:  .  naproxen (NAPROSYN) 500 MG tablet, Take 500 mg by mouth., Disp: , Rfl:  .  SUMAtriptan (IMITREX) 25 MG tablet, Take 1 tablet (25 mg total) by mouth every 2 (two) hours as needed for migraine. May repeat in 2 hours if headache persists or recurs., Disp: 10 tablet, Rfl: 0 .  traZODone (DESYREL) 50 MG tablet, Take 0.5-1 tablets (25-50 mg total) by mouth at bedtime as needed for sleep., Disp: 30 tablet, Rfl: 3  Social History   Tobacco Use  Smoking Status Never Smoker  Smokeless Tobacco Never Used     No Known Allergies Objective:  There were no vitals filed for this visit. There is no height or weight on file to calculate BMI. Constitutional Well developed. Well nourished.  Vascular Dorsalis pedis pulses palpable bilaterally. Posterior tibial pulses palpable bilaterally. Capillary refill normal to all digits.  No cyanosis or clubbing noted. Pedal hair growth normal.  Neurologic Normal speech. Oriented to person, place, and time. Epicritic sensation to light touch grossly present bilaterally.  Dermatologic Nails well groomed and normal in appearance. No open wounds. No skin lesions.  Orthopedic: Normal joint ROM without pain or crepitus bilaterally. No visible deformities. Tender to palpation at the calcaneal tuber left. No pain with calcaneal squeeze left. Ankle ROM full range of motion left. Silf[place]old Test: negative left.   Radiographs: Taken and reviewed. No acute fractures or dislocations. No evidence of stress fracture.  Plantar heel spur present. Posterior heel spur absent.   Assessment:   1. Foot pain, left   2. Plantar fasciitis of left foot    Plan:  Patient was evaluated and treated and all questions answered.  Plantar Fasciitis, left - XR reviewed as above.  - Educated on icing and stretching.  Instructions given.  - Injection delivered to the plantar fascia as below. - DME: Plantar Fascial Brace - Pharmacologic management: None  Procedure: Injection Tendon/Ligament Location: Left plantar fascia at the glabrous junction; medial approach. Skin Prep: alcohol Injectate: 0.5 cc 0.5% marcaine plain, 0.5 cc of 1% Lidocaine, 0.5 cc kenalog 10. Disposition: Patient tolerated procedure well. Injection site dressed with a band-aid.  No follow-ups on file.

## 2020-01-04 ENCOUNTER — Telehealth: Payer: Self-pay | Admitting: Family Medicine

## 2020-01-04 NOTE — Telephone Encounter (Signed)
Tried to call pt to see what she plans on doing now that Dr Deborra Medina is gone, Lvm for pt to call back

## 2020-03-10 ENCOUNTER — Encounter: Payer: BC Managed Care – PPO | Admitting: Family

## 2020-03-24 ENCOUNTER — Other Ambulatory Visit: Payer: Self-pay

## 2020-03-24 ENCOUNTER — Other Ambulatory Visit: Payer: BC Managed Care – PPO

## 2020-03-24 DIAGNOSIS — Z20822 Contact with and (suspected) exposure to covid-19: Secondary | ICD-10-CM

## 2020-03-25 LAB — NOVEL CORONAVIRUS, NAA: SARS-CoV-2, NAA: NOT DETECTED

## 2020-03-29 ENCOUNTER — Other Ambulatory Visit: Payer: Self-pay

## 2020-03-29 ENCOUNTER — Ambulatory Visit: Payer: BC Managed Care – PPO | Admitting: Family

## 2020-03-29 VITALS — BP 140/80 | HR 79 | Temp 98.2°F | Ht 62.0 in | Wt 231.8 lb

## 2020-03-29 DIAGNOSIS — Z6841 Body Mass Index (BMI) 40.0 and over, adult: Secondary | ICD-10-CM

## 2020-03-29 DIAGNOSIS — N644 Mastodynia: Secondary | ICD-10-CM

## 2020-03-29 DIAGNOSIS — E559 Vitamin D deficiency, unspecified: Secondary | ICD-10-CM | POA: Diagnosis not present

## 2020-03-29 DIAGNOSIS — R5383 Other fatigue: Secondary | ICD-10-CM | POA: Diagnosis not present

## 2020-03-29 MED ORDER — TRAZODONE HCL 50 MG PO TABS: 25.0000 mg | ORAL_TABLET | Freq: Every evening | ORAL | 3 refills | Status: DC | PRN

## 2020-03-29 MED ORDER — NAPROXEN 500 MG PO TABS: 500.0000 mg | ORAL_TABLET | Freq: Two times a day (BID) | ORAL | 1 refills | Status: AC

## 2020-03-29 NOTE — Progress Notes (Signed)
Katherine Harris is a 59 y.o. female with the following history as recorded in EpicCare:  Patient Active Problem List   Diagnosis Date Noted  . Breast mass, right 08/09/2019  . Breast pain, left 08/08/2019  . Insomnia 04/29/2018  . Metatarsalgia of left foot 05/30/2016  . Pes planus of left foot 05/30/2016  . Sinus tarsitis of left foot 05/30/2016  . Chronic headaches 07/13/2014  . Urge incontinence 04/18/2011    Current Outpatient Medications  Medication Sig Dispense Refill  . Magnesium 100 MG TABS Take by mouth.     No current facility-administered medications for this visit.    Allergies: Patient has no known allergies.  Past Medical History:  Diagnosis Date  . Anemia    recently taking otc iron supplement per pt.  . Arthritis    right foot pain - otc med, knees   . Breast cyst   . Fibroids   . Fibroids   . GERD (gastroesophageal reflux disease)   . Heavy menstrual bleeding   . HSV (herpes simplex virus) infection   . HSV infection    TYPE 2  . Obesity   . Reflux    very mild per pt. - no meds    Past Surgical History:  Procedure Laterality Date  .  esophageal  stretching  09/2010  . ABDOMINAL HYSTERECTOMY    . BREAST EXCISIONAL BIOPSY     bilateral  . BREAST LUMPECTOMY Right 12/06/2013   Procedure: RIGHT BREAST LUMPECTOMY;  Surgeon: Edward Jolly, MD;  Location: [facility];  Service: General;  Laterality: Right;  . CESAREAN SECTION  07/22/1990  . CYSTOSCOPY  08/26/2011   Procedure: CYSTOSCOPY;  Surgeon: Myra C. Hulan Fray, MD;  Location: [facility];  Service: Gynecology;  Laterality: N/A;  . missed abortion    . SALPINGOOPHORECTOMY  08/26/2011   Procedure: SALPINGO OOPHERECTOMY;  Surgeon: Myra C. Hulan Fray, MD;  Location: [facility];  Service: Gynecology;  Laterality: Bilateral;  . SVD  1997   x 1  . UPPER GASTROINTESTINAL ENDOSCOPY    . WISDOM TOOTH EXTRACTION      Family History  Problem Relation Age of Onset  . Hypertension Mother   . Liver cancer Mother    . Cancer Mother 6       Liver Cancer  . Leukemia Father   . Liver cancer Maternal Aunt 38       Breast Cancer one breast  . Breast cancer Maternal Aunt   . Cancer Paternal Grandmother 21       Breast Cancer  . Breast cancer Paternal Grandmother   . Cancer Cousin 29       Brain Cancer  . Colon cancer Neg Hx   . Stomach cancer Neg Hx     Social History   Tobacco Use  . Smoking status: Never Smoker  . Smokeless tobacco: Never Used  Substance Use Topics  . Alcohol use: No    Subjective:  Presents today as a TOC; her last PCP left her practice earlier this year;   1) Chronic breast pain; last mammogram 08/2019; 2) Concerned about weight; would like to see weight management; 3) Recurrent headaches- overdue to see eye doctor;     Objective:  Vitals:   03/29/20 1155  BP: 140/80  Pulse: 79  Temp: 98.2 F (36.8 C)  TempSrc: Oral  SpO2: 98%  Weight: 231 lb 12.8 oz (105.1 kg)  Height: _0  (1.575 m)    General: Well developed, well nourished, in  no acute distress  Skin : Warm and dry.  Head: Normocephalic and atraumatic  Eyes: Sclera and conjunctiva clear; pupils round and reactive to light; extraocular movements intact  Ears: External normal; canals clear; tympanic membranes normal  Oropharynx: Pink, supple. No suspicious lesions  Neck: Supple without thyromegaly, adenopathy  Lungs: Respirations unlabored; clear to auscultation bilaterally without wheeze, rales, rhonchi  CVS exam: normal rate and regular rhythm.  Abdomen: Soft; nontender; nondistended; normoactive bowel sounds; no masses or hepatosplenomegaly  Musculoskeletal: No deformities; no active joint inflammation  Extremities: No edema, cyanosis, clubbing  Vessels: Symmetric bilaterally  Neurologic: Alert and oriented; speech intact; face symmetrical; moves all extremities well; CNII-XII intact without focal deficit  Assessment:  1. Breast pain, left   2. Class 3 severe obesity without serious comorbidity  with body mass index (BMI) of 40.0 to 44.9 in adult, unspecified obesity type (HCC)   3. Other fatigue   4. Vitamin D deficiency     Plan:  1. Refer for surgical consult; mammogram was normal in February 2021; 2. Refer to weight loss group; 3. Update labs today; 4. Check Vitamin D level;  This visit occurred during the SARS-CoV-2 public health emergency.  Safety protocols were in place, including screening questions prior to the visit, additional usage of staff PPE, and extensive cleaning of exam room while observing appropriate contact time as indicated for disinfecting solutions.     No follow-ups on file.  Orders Placed This Encounter  Procedures  . CBC with Differential/Platelet    Standing Status:   Future    Standing Expiration Date:   03/29/2021  . Comp Met (CMET)    Standing Status:   Future    Standing Expiration Date:   03/29/2021  . TSH    Standing Status:   Future    Standing Expiration Date:   03/29/2021  . Vitamin D (25 hydroxy)    Standing Status:   Future    Standing Expiration Date:   03/29/2021  . Ambulatory referral to General Surgery    Referral Priority:   Routine    Referral Type:   Surgical    Referral Reason:   Specialty Services Required    Requested Specialty:   General Surgery    Number of Visits Requested:   1  . Amb Ref to Medical Weight Management    Referral Priority:   Routine    Referral Type:   Consultation    Number of Visits Requested:   1    Requested Prescriptions    No prescriptions requested or ordered in this encounter

## 2020-03-30 ENCOUNTER — Other Ambulatory Visit: Payer: Self-pay | Admitting: Family

## 2020-03-30 LAB — COMPREHENSIVE METABOLIC PANEL
AG Ratio: 1.5 (calc) (ref 1.0–2.5)
ALT: 6 U/L (ref 6–29)
AST: 13 U/L (ref 10–35)
Albumin: 4.1 g/dL (ref 3.6–5.1)
Alkaline phosphatase (APISO): 64 U/L (ref 37–153)
BUN: 13 mg/dL (ref 7–25)
CO2: 29 mmol/L (ref 20–32)
Calcium: 9.7 mg/dL (ref 8.6–10.4)
Chloride: 105 mmol/L (ref 98–110)
Creat: 0.85 mg/dL (ref 0.50–1.05)
Globulin: 2.7 g/dL (calc) (ref 1.9–3.7)
Glucose, Bld: 79 mg/dL (ref 65–99)
Potassium: 4.5 mmol/L (ref 3.5–5.3)
Sodium: 140 mmol/L (ref 135–146)
Total Bilirubin: 0.4 mg/dL (ref 0.2–1.2)
Total Protein: 6.8 g/dL (ref 6.1–8.1)

## 2020-03-30 LAB — CBC WITH DIFFERENTIAL/PLATELET
Absolute Monocytes: 486 cells/uL (ref 200–950)
Basophils Absolute: 32 cells/uL (ref 0–200)
Basophils Relative: 0.7 %
Eosinophils Absolute: 131 cells/uL (ref 15–500)
Eosinophils Relative: 2.9 %
HCT: 43.7 % (ref 35.0–45.0)
Hemoglobin: 13.9 g/dL (ref 11.7–15.5)
Lymphs Abs: 1607 cells/uL (ref 850–3900)
MCH: 29.3 pg (ref 27.0–33.0)
MCHC: 31.8 g/dL — ABNORMAL LOW (ref 32.0–36.0)
MCV: 92.2 fL (ref 80.0–100.0)
MPV: 12.7 fL — ABNORMAL HIGH (ref 7.5–12.5)
Monocytes Relative: 10.8 %
Neutro Abs: 2246 cells/uL (ref 1500–7800)
Neutrophils Relative %: 49.9 %
Platelets: 172 10*3/uL (ref 140–400)
RBC: 4.74 10*6/uL (ref 3.80–5.10)
RDW: 11.4 % (ref 11.0–15.0)
Total Lymphocyte: 35.7 %
WBC: 4.5 10*3/uL (ref 3.8–10.8)

## 2020-03-30 LAB — VITAMIN D 25 HYDROXY (VIT D DEFICIENCY, FRACTURES): Vit D, 25-Hydroxy: 22 ng/mL — ABNORMAL LOW (ref 30–100)

## 2020-03-30 LAB — TSH: TSH: 1.58 mIU/L (ref 0.40–4.50)

## 2020-03-30 MED ORDER — VITAMIN D (ERGOCALCIFEROL) 1.25 MG (50000 UNIT) PO CAPS: 50000.0000 [IU] | ORAL_CAPSULE | ORAL | 0 refills | Status: AC

## 2020-07-10 ENCOUNTER — Ambulatory Visit: Payer: BC Managed Care – PPO | Attending: Critical Care Medicine

## 2020-07-10 ENCOUNTER — Ambulatory Visit: Payer: BC Managed Care – PPO

## 2020-07-24 ENCOUNTER — Ambulatory Visit: Payer: BC Managed Care – PPO

## 2020-07-26 ENCOUNTER — Other Ambulatory Visit: Payer: Self-pay

## 2020-07-26 DIAGNOSIS — Z20822 Contact with and (suspected) exposure to covid-19: Secondary | ICD-10-CM

## 2020-07-27 ENCOUNTER — Ambulatory Visit: Payer: BC Managed Care – PPO

## 2020-07-30 LAB — NOVEL CORONAVIRUS, NAA: SARS-CoV-2, NAA: NOT DETECTED

## 2020-08-11 ENCOUNTER — Encounter (INDEPENDENT_AMBULATORY_CARE_PROVIDER_SITE_OTHER): Payer: Self-pay

## 2020-08-25 ENCOUNTER — Other Ambulatory Visit: Payer: Self-pay | Admitting: Internal Medicine

## 2020-08-25 ENCOUNTER — Ambulatory Visit: Payer: Self-pay | Attending: Internal Medicine

## 2020-08-25 DIAGNOSIS — Z23 Encounter for immunization: Secondary | ICD-10-CM

## 2020-08-25 NOTE — Progress Notes (Signed)
   Covid-19 Vaccination Clinic  Name:  Katherine Harris    MRN: [id number] DOB: 1960-09-29  08/25/2020  [name] was observed post Covid-19 immunization for 15 minutes without incident. She was provided with Vaccine Information Sheet and instruction to access the V-Safe system.   [name] was instructed to call 911 with any severe reactions post vaccine: Marland Kitchen Difficulty breathing  . Swelling of face and throat  . A fast heartbeat  . A bad rash all over body  . Dizziness and weakness   Immunizations Administered    Name Date Dose VIS Date Route   PFIZER Comrnaty(Gray TOP) Covid-19 Vaccine 08/25/2020 12:34 PM 0.3 mL 06/29/2020 Intramuscular   Manufacturer: [employer]   Lot: [id number]   NDC: [phone]

## 2020-09-04 ENCOUNTER — Other Ambulatory Visit: Payer: Self-pay

## 2020-09-04 DIAGNOSIS — Z20822 Contact with and (suspected) exposure to covid-19: Secondary | ICD-10-CM

## 2020-09-05 LAB — SARS-COV-2, NAA 2 DAY TAT

## 2020-09-05 LAB — NOVEL CORONAVIRUS, NAA: SARS-CoV-2, NAA: DETECTED — AB

## 2020-09-06 ENCOUNTER — Telehealth: Payer: Self-pay

## 2020-09-06 NOTE — Telephone Encounter (Signed)
Called to discuss with patient about COVID-19 symptoms and the use of one of the available treatments for those with mild to moderate Covid symptoms and at a high risk of hospitalization.  Pt appears to qualify for outpatient treatment due to co-morbid conditions and/or a member of an at-risk group in accordance with the FDA Emergency Use Authorization.    Symptom onset: 09/01/20 Headache,fever,nasal congestion Vaccinated: Yes Booster? Yes Immunocompromised? No Qualifiers: Obesity  Pt. Reports her symptoms are mild today and declines.  Marcello Moores

## 2020-09-29 ENCOUNTER — Other Ambulatory Visit: Payer: Self-pay | Admitting: Family

## 2020-09-29 DIAGNOSIS — Z1231 Encounter for screening mammogram for malignant neoplasm of breast: Secondary | ICD-10-CM

## 2020-10-23 ENCOUNTER — Other Ambulatory Visit: Payer: Self-pay

## 2020-10-23 ENCOUNTER — Ambulatory Visit
Admission: RE | Admit: 2020-10-23 | Discharge: 2020-10-23 | Disposition: A | Discharge status: Home / Self Care | Payer: BC Managed Care – PPO | Source: Ambulatory Visit | Attending: Family | Admitting: Family

## 2020-10-23 DIAGNOSIS — Z1231 Encounter for screening mammogram for malignant neoplasm of breast: Secondary | ICD-10-CM

## 2020-11-08 ENCOUNTER — Ambulatory Visit (INDEPENDENT_AMBULATORY_CARE_PROVIDER_SITE_OTHER): Payer: BC Managed Care – PPO

## 2020-11-08 ENCOUNTER — Ambulatory Visit: Payer: BC Managed Care – PPO | Admitting: Podiatry

## 2020-11-08 ENCOUNTER — Encounter: Payer: Self-pay | Admitting: Podiatry

## 2020-11-08 ENCOUNTER — Other Ambulatory Visit: Payer: Self-pay

## 2020-11-08 DIAGNOSIS — M21862 Other specified acquired deformities of left lower leg: Secondary | ICD-10-CM

## 2020-11-08 DIAGNOSIS — M216X2 Other acquired deformities of left foot: Secondary | ICD-10-CM

## 2020-11-08 DIAGNOSIS — M76821 Posterior tibial tendinitis, right leg: Secondary | ICD-10-CM

## 2020-11-08 DIAGNOSIS — M2141 Flat foot [pes planus] (acquired), right foot: Secondary | ICD-10-CM | POA: Diagnosis not present

## 2020-11-08 DIAGNOSIS — M21861 Other specified acquired deformities of right lower leg: Secondary | ICD-10-CM

## 2020-11-08 DIAGNOSIS — M216X1 Other acquired deformities of right foot: Secondary | ICD-10-CM

## 2020-11-08 DIAGNOSIS — M76822 Posterior tibial tendinitis, left leg: Secondary | ICD-10-CM

## 2020-11-08 DIAGNOSIS — M2142 Flat foot [pes planus] (acquired), left foot: Secondary | ICD-10-CM

## 2020-11-08 NOTE — Patient Instructions (Addendum)
For your knees: [email or web address]  ADDRESS [place], [place] [address]  CONTACT Phone: [phone]  Phone: [phone]  Fax: [phone]   Posterior Tibial Tendinitis  Posterior tibial tendinitis is irritation of a tendon called the posterior tibial tendon. Your posterior tibial tendon is a cord-like tissue that connects bones of your lower leg and foot to a muscle that: 1. Supports your arch. 2. Helps you raise up on your toes. 3. Helps you turn your foot down and in. This condition causes foot and ankle pain. It can also lead to a flat foot. What are the causes? This condition is most often caused by repeated stress to the tendon (overuse injury). It can also be caused by a sudden injury that stresses the tendon, such as landing on your foot after jumping or falling. What increases the risk? This condition is more likely to develop in: 1. People who play a sport that involves putting a lot of pressure on the feet, such as: 1. Basketball. 2. Tennis. 3. Soccer. 4. Hockey. 2. Runners. 3. Females who are older than 58 years of age and are overweight. 4. People with diabetes. 5. People with decreased foot stability. 6. People with flat feet. What are the signs or symptoms? Symptoms include: 1. Pain in the inner ankle. 2. Pain at the arch of your foot. 3. Pain that gets worse with running, walking, or standing. 4. Swelling on the inside of your ankle and foot. 5. Weakness in your ankle or foot. 6. Inability to stand up on tiptoe. 7. Flattening of the arch of your foot. How is this diagnosed? This condition may be diagnosed based on: 1. Your symptoms. 2. Your medical history. 3. A physical exam. 4. Tests, such as: 1. X-ray. 2. MRI. 3. Ultrasound. How is this treated? This condition may be treated by: 1. Putting ice to the injured area. 2. Taking NSAIDs, such as ibuprofen, to reduce pain and swelling. 3. Wearing  a special shoe or shoe insert to support your arch (orthotic). 4. Having physical therapy. 5. Replacing high-impact exercise with low-impact exercise, such as swimming or cycling. If your symptoms do not improve with these treatments, you may need to wear a splint, removable walking boot, or short leg cast for 6-8 weeks to keep your foot and ankle still (immobilized). Follow these instructions at home: If you have a cast, splint, or boot:  Keep it clean and dry.  Check the skin around it every day. Tell your health care provider about any concerns. If you have a cast:  Do not stick anything inside it to scratch your skin. Doing that increases your risk of infection.  You may put lotion on dry skin around the edges of the cast. Do not put lotion on the skin underneath the cast. If you have a splint or boot:  Wear it as told by your health care provider. Remove it only as told by your health care provider.  Loosen it if your toes tingle, become numb, or turn cold and blue. Bathing 1. Do not take baths, swim, or use a hot tub until your health care provider approves. Ask your health care provider if you may take showers. 2. If your cast, splint, or boot is not waterproof: ? Do not let it get wet. ? Cover it with a waterproof covering while you take a bath or a shower. Managing pain and swelling   1. If directed, put ice on the injured area. ? If you  have a removable splint or boot, remove it as told by your health care provider. ? Put ice in a plastic bag. ? Place a towel between your skin and the bag or between your cast and the bag. ? Leave the ice on for 20 minutes, 2-3 times a day. 2. Move your toes often to reduce stiffness and swelling. 3. Raise (elevate) the injured area above the level of your heart while you are sitting or lying down. Activity  Do not use the injured foot to support your body weight until your health care provider says that you can. Use crutches as told by  your health care provider.  Do not do activities that make pain or swelling worse.  Ask your health care provider when it is safe to drive if you have a cast, splint, or boot on your foot.  Return to your normal activities as told by your health care provider. Ask your health care provider what activities are safe for you.  Do exercises as told by your health care provider. General instructions  Take over-the-counter and prescription medicines only as told by your health care provider.  If you have an orthotic, use it as told by your health care provider.  Keep all follow-up visits as told by your health care provider. This is important. How is this prevented?  Wear footwear that is appropriate to your athletic activity.  Avoid athletic activities that cause pain or swelling in your ankle or foot.  Before being active, do range-of-motion and stretching exercises.  If you develop pain or swelling while training, stop training.  If you have pain or swelling that does not improve after a few days of rest, see your health care provider.  If you start a new athletic activity, start gradually so you can build up your strength and flexibility. Contact a health care provider if:  Your symptoms get worse.  Your symptoms do not improve in 6-8 weeks.  You develop new, unexplained symptoms.  Your splint, boot, or cast gets damaged. Summary  Posterior tibial tendinitis is irritation of a tendon called the posterior tibial tendon.  This condition is most often caused by repeated stress to the tendon (overuse injury).  This condition causes foot pain and ankle pain. It can also lead to a flat foot.  This condition may be treated by not doing high-impact activities, applying ice, having physical therapy, wearing orthotics, and wearing a cast, splint, or boot if needed. This information is not intended to replace advice given to you by your health care provider. Make sure you discuss  any questions you have with your health care provider. Document Revised: 11/03/2018 Document Reviewed: 09/10/2018 Elsevier Patient Education  [place] [place].  Posterior Tibial Tendinitis Rehab Ask your health care provider which exercises are safe for you. Do exercises exactly as told by your health care provider and adjust them as directed. It is normal to feel mild stretching, pulling, tightness, or discomfort as you do these exercises. Stop right away if you feel sudden pain or your pain gets worse. Do not begin these exercises until told by your health care provider. Stretching and range-of-motion exercises These exercises warm up your muscles and joints and improve the movement and flexibility in your ankle and foot. These exercises may also help to relieve pain. Standing wall calf stretch, knee straight   4. Stand with your hands against a wall. 5. Extend your left / right leg behind you, and bend your front knee slightly.  If directed, place a folded washcloth under the arch of your foot for support. 6. Point the toes of your back foot slightly inward. 7. Keeping your heels on the floor and your back knee straight, shift your weight toward the wall. Do not allow your back to arch. You should feel a gentle stretch in your upper left / right calf. 8. Hold this position for 10 seconds. Repeat 10 times. Complete this exercise 2 times a day. Standing wall calf stretch, knee bent 7. Stand with your hands against a wall. 8. Extend your left / right leg behind you, and bend your front knee slightly. If directed, place a folded washcloth under the arch of your foot for support. 9. Point the toes of your back foot slightly inward. 10. Unlock your back knee so it is bent. Keep your heels on the floor. You should feel a gentle stretch deep in your lower left / right calf. 11. Hold this position for 10 seconds. Repeat 10 times. Complete this exercise 2 times a day. Strengthening exercises These  exercises build strength and endurance in your ankle and foot. Endurance is the ability to use your muscles for a long time, even after they get tired. Ankle inversion with band 8. Secure one end of a rubber exercise band or tubing to a fixed object, such as a table leg or a pole, that will stay still when the band is pulled. 9. Loop the other end of the band around the middle of your left / right foot. 10. Sit on the floor facing the object with your left / right leg extended. The band or tube should be slightly tense when your foot is relaxed. 38. Leading with your big toe, slowly bring your left / right foot and ankle inward, toward your other foot (inversion). 12. Hold this position for 10 seconds. 13. Slowly return your foot to the starting position. Repeat 10 times. Complete this exercise 2 times a day. Towel curls   5. Sit in a chair on a non-carpeted surface, and put your feet on the floor. 6. Place a towel in front of your feet. 7. Keeping your heel on the floor, put your left / right foot on the towel. 8. Pull the towel toward you by grabbing the towel with your toes and curling them under. Keep your heel on the floor while you do this. 9. Let your toes relax. 10. Grab the towel with your toes again. Keep going until the towel is completely underneath your foot. Repeat 10 times. Complete this exercise 2 times a day. Balance exercise This exercise improves or maintains your balance. Balance is important in preventing falls. Single leg stand 6. Without wearing shoes, stand near a railing or in a doorway. You may hold on to the railing or door frame as needed for balance. 7. Stand on your left / right foot. Keep your big toe down on the floor and try to keep your arch lifted. ? If balancing in this position is too easy, try the exercise with your eyes closed or while standing on a pillow. 8. Hold this position for 10 seconds. Repeat 10 times. Complete this exercise 2 times a  day. This information is not intended to replace advice given to you by your health care provider. Make sure you discuss any questions you have with your health care provider.

## 2020-11-12 NOTE — Progress Notes (Signed)
  Subjective:  Patient ID: Katherine Harris, female    DOB: 1961-07-07,  MRN: [id number]  Chief Complaint  Patient presents with  . Ankle Pain    Patient presents today for left medial ankle pain x 4 months off and on.  She says its a sharp pain and feels like a cramp that comes and goes    60 y.o. female presents with the above complaint. History confirmed with patient.  She also has this on the right side but is not as bad  Objective:  Physical Exam: warm, good capillary refill, no trophic changes or ulcerative lesions, normal DP and PT pulses and normal sensory exam.  Bilaterally she has pes planovalgus with pain along the posterior tibial tendon and sinus tarsi.  Unable to do single and double heel rises.  Gastrocnemius equinus present.  Radiographs: X-ray of both feet: Right foot radiographs taken today, reviewed her previous left foot radiographs she has severe pes planovalgus deformity with collapse of the longitudinal arch Assessment:   1. Posterior tibial tendon dysfunction (PTTD) of both lower extremities   2. Pes planus of both feet   3. Gastrocnemius equinus of left lower extremity   4. Gastrocnemius equinus of right lower extremity      Plan:  Patient was evaluated and treated and all questions answered.  Discussed the etiology, pathomechanics and treatment options in detail with the patient. We also reviewed today's radiographs in detail.  We discussed how pes planus deformity without pain or functional limitation is quite common  and often does not require any treatment.  However when pain or functional limitation arises, treatment with nonsurgical therapy is our first line with stretching, physical therapy, and supportive orthoses.  Also discussed that when these treatments fail, often patients do well with surgical treatment of these deformities.  Today I recommended that we obtain MRIs of both feet to evaluate the posterior tibial tendon which I believe is dysfunctional  and possibly could be tearing explained mostly pain and swelling and edema she has here.  Bilateral MRIs ordered.  These are for possible surgical planning.   Return in about 6 weeks (around 12/20/2020) for re-check posterior tendinitis.

## 2020-12-13 IMAGING — US US BREAST*L* LIMITED INC AXILLA
1 series · 4 of 4 positions shown · non-contrast
Comparison: Previous exam(s).

CLINICAL DATA: 58-year-old female presenting for evaluation of a
palpable lump in the right breast identified 3 months ago. The
patient is also having regional pain in the upper left breast which
has been intermittent for a year.

EXAM:
DIGITAL DIAGNOSTIC BILATERAL MAMMOGRAM WITH CAD AND TOMO
BILATERAL BREAST ULTRASOUND

[Series 1: us breast*left* limited inc axilla · 0.07mm/px · 4 of 4 slices shown]
[im 1/4]
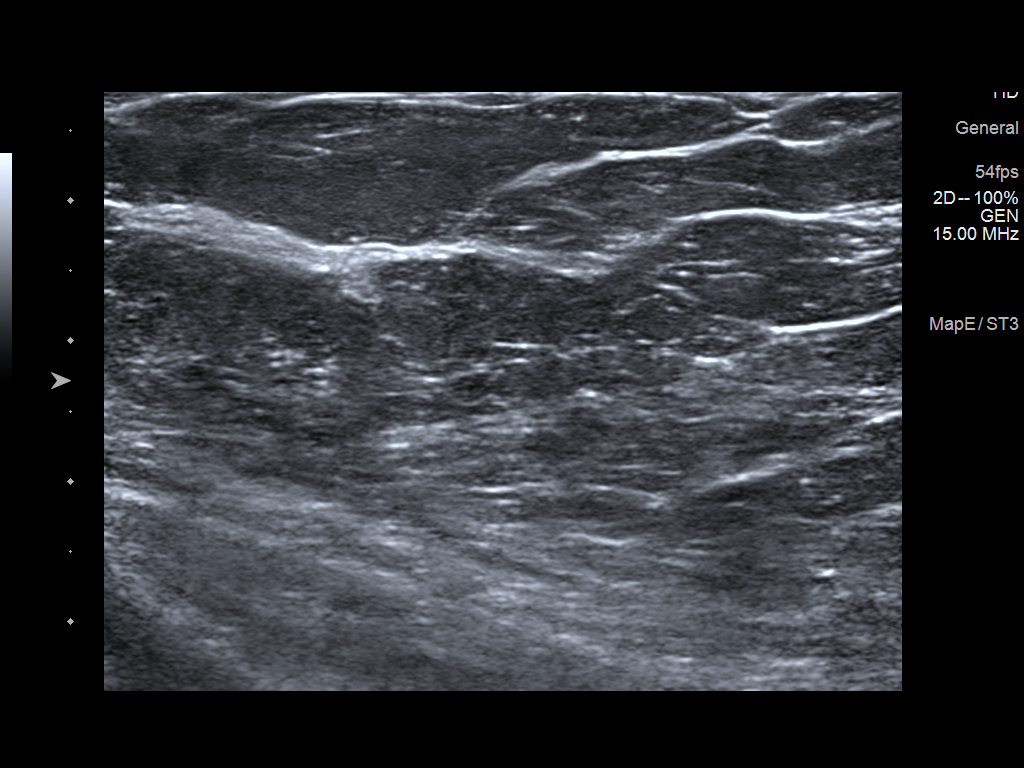
[im 2/4]
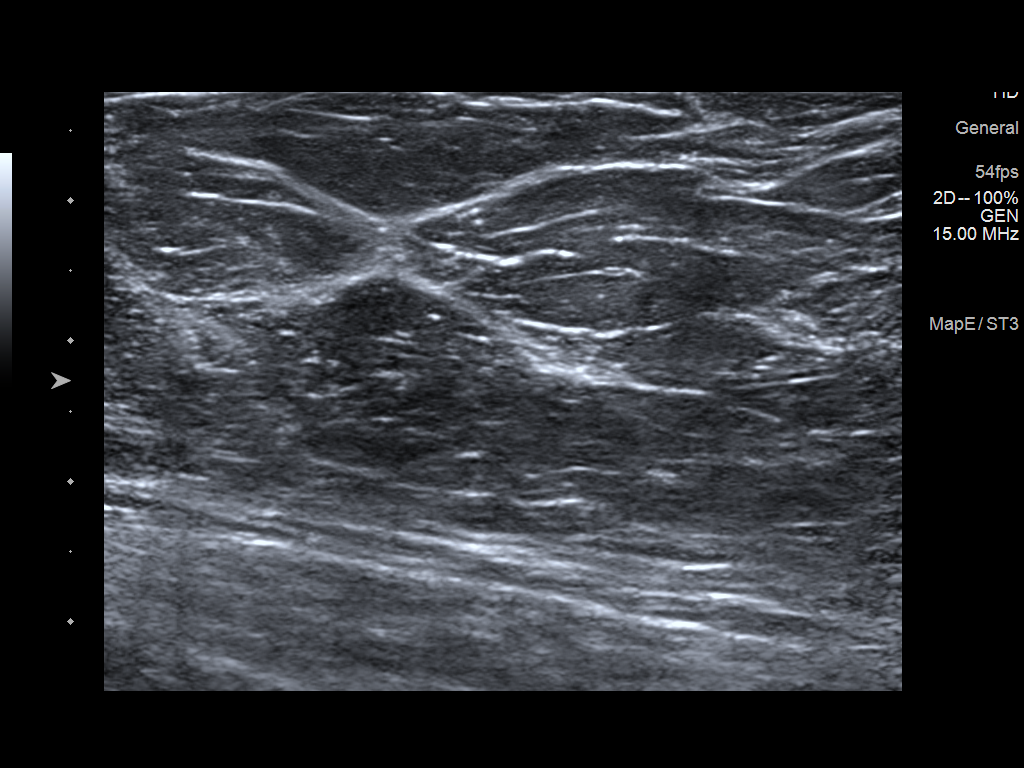
[im 3/4]
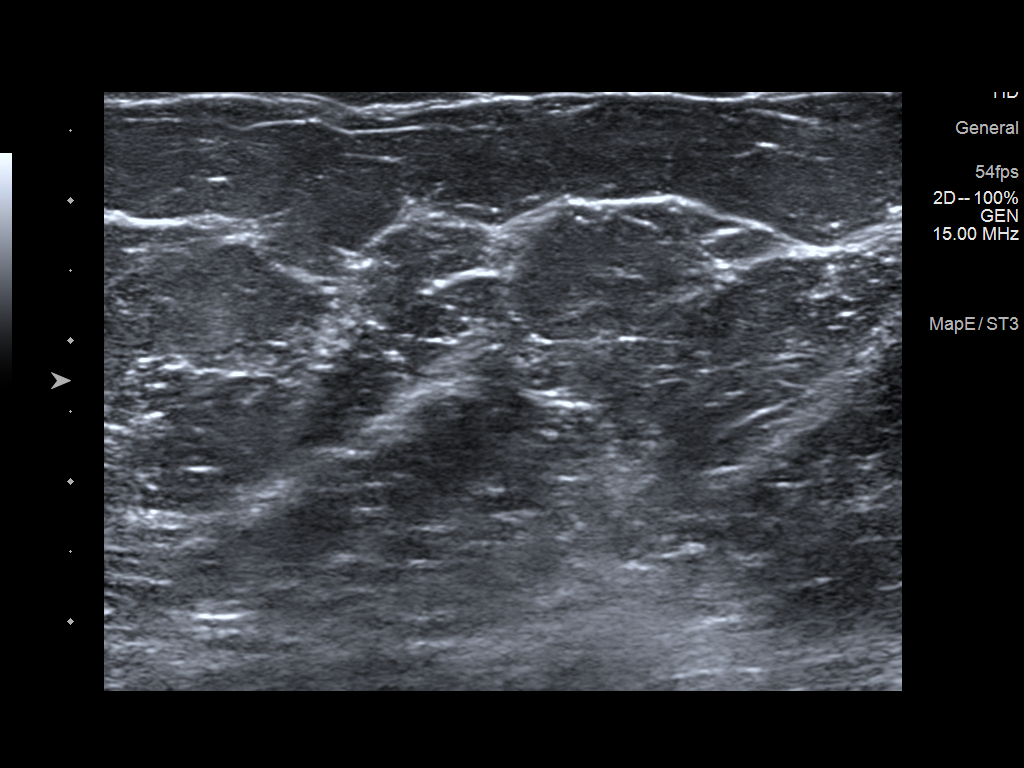
[im 4/4]
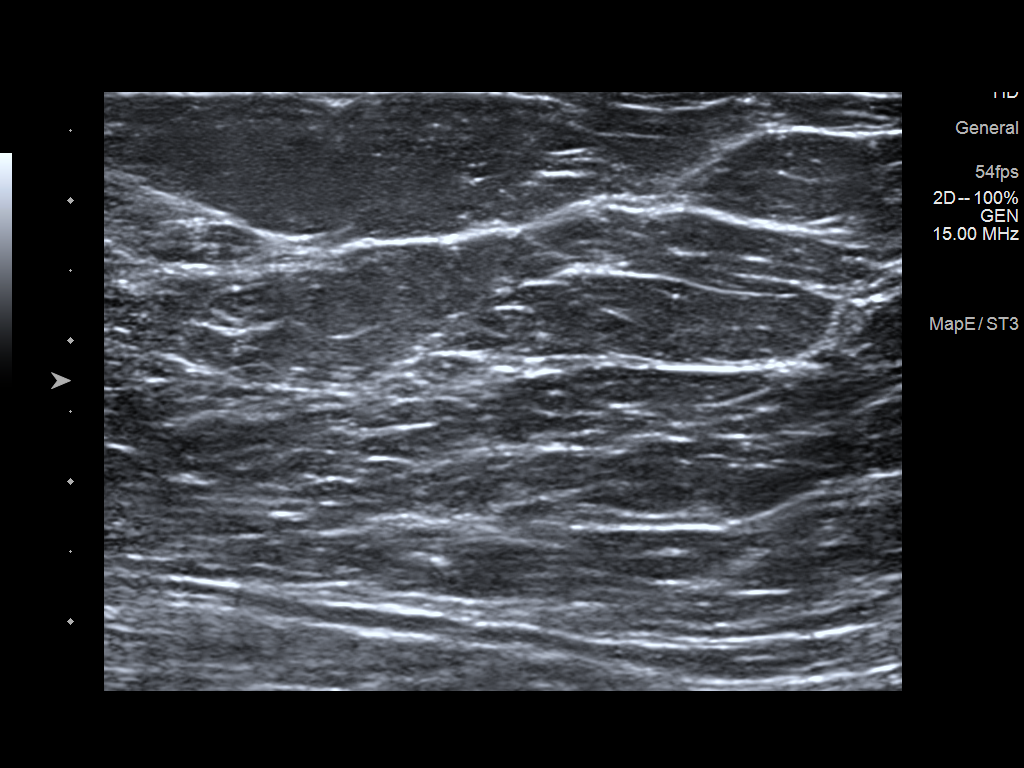

[4 of 4 positions shown; findings below may reference images not displayed]

ACR Breast Density Category b: There are scattered areas of
fibroglandular density.
FINDINGS: A BB has been placed along the lateral periareolar aspect of the
right breast indicating the palpable site of concern. There are no
suspicious mammographic findings deep to the marker. There is a tiny
circumscribed lobulated mass at the site, which has been stable for
several years. No abnormal findings are seen in the superior left
breast to explain the patient's left breast pain. No suspicious
calcifications, masses or areas of distortion are seen in the
bilateral breasts.

Mammographic images were processed with CAD.

Physical exam of the palpable site in the periareolar right breast
demonstrates no discrete palpable masses.

Ultrasound targeted to the right breast at 12 o'clock, 1 cm from the
nipple demonstrates a bilobed anechoic oval circumscribed
superficial mass measuring 9 x 5 x 6 mm. No masses or suspicious
areas of shadowing are identified. Ultrasound of the superior left
breast at the site of tenderness demonstrates normal fibroglandular
tissue. No suspicious masses or areas of shadowing are identified.
IMPRESSION: 1. The palpable lump in the right breast corresponds with a benign
cyst.

2. No mammographic or targeted sonographic abnormalities at the site
of tenderness in the superior left breast.

3.  No mammographic evidence of malignancy in the bilateral breasts.

RECOMMENDATION:
Screening mammogram in one year.(Code:[id number]-V-8P8)

I have discussed the findings and recommendations with the patient.
If applicable, a reminder letter will be sent to the patient
regarding the next appointment.

BI-RADS CATEGORY  2: Benign.

## 2020-12-27 ENCOUNTER — Ambulatory Visit: Payer: BC Managed Care – PPO | Admitting: Podiatry

## 2021-02-09 ENCOUNTER — Ambulatory Visit: Payer: BC Managed Care – PPO

## 2021-08-30 ENCOUNTER — Other Ambulatory Visit (HOSPITAL_COMMUNITY): Payer: Self-pay

## 2021-09-19 ENCOUNTER — Other Ambulatory Visit (HOSPITAL_COMMUNITY): Payer: Self-pay

## 2021-09-27 ENCOUNTER — Other Ambulatory Visit (HOSPITAL_COMMUNITY): Payer: Self-pay

## 2021-10-04 ENCOUNTER — Other Ambulatory Visit: Payer: Self-pay | Admitting: Internal Medicine

## 2021-10-15 ENCOUNTER — Other Ambulatory Visit: Payer: Self-pay | Admitting: Obstetrics and Gynecology

## 2021-10-15 DIAGNOSIS — N6320 Unspecified lump in the left breast, unspecified quadrant: Secondary | ICD-10-CM

## 2021-10-15 DIAGNOSIS — N644 Mastodynia: Secondary | ICD-10-CM

## 2021-11-30 ENCOUNTER — Ambulatory Visit: Payer: BC Managed Care – PPO | Admitting: Family

## 2021-12-18 ENCOUNTER — Ambulatory Visit: Payer: BC Managed Care – PPO | Admitting: Family

## 2021-12-18 VITALS — BP 120/80 | HR 77 | Temp 97.9°F | Resp 18 | Ht 62.0 in | Wt 218.6 lb

## 2021-12-18 DIAGNOSIS — Z13220 Encounter for screening for lipoid disorders: Secondary | ICD-10-CM | POA: Diagnosis not present

## 2021-12-18 DIAGNOSIS — N644 Mastodynia: Secondary | ICD-10-CM | POA: Diagnosis not present

## 2021-12-18 NOTE — Patient Instructions (Signed)
You can go to [facility] lab office for your labs; you can go at your convenience; they are open from 7:30 am-5:30 pm; The address is [place], [place];

## 2021-12-18 NOTE — Progress Notes (Signed)
Katherine Harris is a 61 y.o. female with the following history as recorded in Epic[employer]:  Patient Active Problem List   Diagnosis Date Noted   Breast mass, right 08/09/2019   Breast pain, left 08/08/2019   Insomnia 04/29/2018   Metatarsalgia of left foot 05/30/2016   Pes planus of left foot 05/30/2016   Sinus tarsitis of left foot 05/30/2016   Chronic headaches 07/13/2014   Urge incontinence 04/18/2011    Current Outpatient Medications  Medication Sig Dispense Refill   Magnesium 100 MG TABS Take by mouth.     naproxen (NAPROSYN) 500 MG tablet Take 1 tablet (500 mg total) by mouth 2 (two) times daily with a meal. 60 tablet 1   traZODone (DESYREL) 50 MG tablet Take 0.5-1 tablets (25-50 mg total) by mouth at bedtime as needed for sleep. 30 tablet 3   No current facility-administered medications for this visit.    Allergies: Patient has no known allergies.  Past Medical History:  Diagnosis Date   Anemia    recently taking otc iron supplement per pt.   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 - no meds    Past Surgical History:  Procedure Laterality Date    esophageal  stretching  09/2010   ABDOMINAL HYSTERECTOMY     BREAST EXCISIONAL BIOPSY     bilateral   BREAST LUMPECTOMY Right 12/06/2013   Procedure: RIGHT BREAST LUMPECTOMY;  Surgeon: Edward Jolly, MD;  Location: [facility];  Service: General;  Laterality: Right;   CESAREAN SECTION  07/22/1990   CYSTOSCOPY  08/26/2011   Procedure: CYSTOSCOPY;  Surgeon: Myra C. Hulan Fray, MD;  Location: [facility];  Service: Gynecology;  Laterality: N/A;   missed abortion     SALPINGOOPHORECTOMY  08/26/2011   Procedure: SALPINGO OOPHERECTOMY;  Surgeon: Myra C. Hulan Fray, MD;  Location: [facility];  Service: Gynecology;   Laterality: Bilateral;   SVD  1997   x 1   UPPER GASTROINTESTINAL ENDOSCOPY     WISDOM TOOTH EXTRACTION      Family History  Problem Relation Age of Onset   Hypertension Mother    Liver cancer Mother    Cancer Mother 34       Liver Cancer   Leukemia Father    Liver cancer Maternal Aunt 9       Breast Cancer one breast   Breast cancer Maternal Aunt    Cancer Paternal Grandmother 25       Breast Cancer   Breast cancer Paternal Grandmother    Cancer Cousin 3       Brain Cancer   Colon cancer Neg Hx    Stomach cancer Neg Hx     Social History   Tobacco Use   Smoking status: Never   Smokeless tobacco: Never  Substance Use Topics   Alcohol use: No    Subjective:   Patient has not been seen by me since 2021; has moved to [place], [place] and has been living there for the past 11 months; will be there for at least 3 more years but has not established with new PCP; Requesting to get updated order  for diagnostic mammogram; at last OV in 2021, patient and I discussed recurrent breast pain; she did see a specialist but does not remember who she saw or what type of follow up was needed;  Patient is also overdue for fasting labs and would be interested in getting these done when she comes back to [facility] for her mammogram;      Objective:  Vitals:   12/18/21 1332  BP: 120/80  Pulse: 77  Resp: 18  Temp: 97.9 F (36.6 C)  TempSrc: Oral  SpO2: 96%  Weight: 218 lb 9.6 oz (99.2 kg)  Height: '5\' 2"'  (1.575 m)    General: Well developed, well nourished, in no acute distress  Skin : Warm and dry.  Head: Normocephalic and atraumatic  Lungs: Respirations unlabored;  Neurologic: Alert and oriented; speech intact; face symmetrical; moves all extremities well; CNII-XII intact without focal deficit  Breast exam- normal bilaterally  Assessment:  1. Breast pain, left   2. Lipid screening     Plan:  Update diagnostic bilateral mammogram; check CBC, CMP as well- she plans to do  this at later date at [facility] location; Order in place to check at later date;  Patient will need to establish with PCP in [facility] even if this move is temporary for the next few years. She does plan to do this but has been able to find a provider yet.   No follow-ups on file.  Orders Placed This Encounter  Procedures   MM Digital Diagnostic Bilat    Standing Status:   Future    Standing Expiration Date:   12/19/2022    Order Specific Question:   Reason for Exam (SYMPTOM  OR DIAGNOSIS REQUIRED)    Answer:   left breast pain    Order Specific Question:   Is the patient pregnant?    Answer:   No    Order Specific Question:   Preferred imaging location?    Answer:   [facility]   CBC with Differential/Platelet    Standing Status:   Future    Standing Expiration Date:   12/19/2022   Comp Met (CMET)    Standing Status:   Future    Standing Expiration Date:   12/19/2022   Lipid panel    Standing Status:   Future    Standing Expiration Date:   12/19/2022    Requested Prescriptions    No prescriptions requested or ordered in this encounter

## 2022-02-04 ENCOUNTER — Other Ambulatory Visit (INDEPENDENT_AMBULATORY_CARE_PROVIDER_SITE_OTHER): Payer: BC Managed Care – PPO

## 2022-02-04 ENCOUNTER — Other Ambulatory Visit: Payer: BC Managed Care – PPO

## 2022-02-04 ENCOUNTER — Ambulatory Visit
Admission: RE | Admit: 2022-02-04 | Discharge: 2022-02-04 | Disposition: A | Discharge status: Home / Self Care | Payer: BC Managed Care – PPO | Source: Ambulatory Visit | Attending: Obstetrics and Gynecology | Admitting: Obstetrics and Gynecology

## 2022-02-04 ENCOUNTER — Other Ambulatory Visit: Payer: Self-pay | Admitting: Obstetrics and Gynecology

## 2022-02-04 DIAGNOSIS — N6452 Nipple discharge: Secondary | ICD-10-CM

## 2022-02-04 DIAGNOSIS — N6320 Unspecified lump in the left breast, unspecified quadrant: Secondary | ICD-10-CM

## 2022-02-04 DIAGNOSIS — N644 Mastodynia: Secondary | ICD-10-CM

## 2022-02-04 DIAGNOSIS — N6310 Unspecified lump in the right breast, unspecified quadrant: Secondary | ICD-10-CM

## 2022-02-04 DIAGNOSIS — Z13220 Encounter for screening for lipoid disorders: Secondary | ICD-10-CM | POA: Diagnosis not present

## 2022-02-04 LAB — COMPREHENSIVE METABOLIC PANEL
ALT: 8 U/L (ref 0–35)
AST: 14 U/L (ref 0–37)
Albumin: 4.3 g/dL (ref 3.5–5.2)
Alkaline Phosphatase: 58 U/L (ref 39–117)
BUN: 13 mg/dL (ref 6–23)
CO2: 26 mEq/L (ref 19–32)
Calcium: 9.5 mg/dL (ref 8.4–10.5)
Chloride: 104 mEq/L (ref 96–112)
Creatinine, Ser: 0.74 mg/dL (ref 0.40–1.20)
GFR: 89.01 mL/min (ref >60.00)
Glucose, Bld: 94 mg/dL (ref 70–99)
Potassium: 4.2 mEq/L (ref 3.5–5.1)
Sodium: 138 mEq/L (ref 135–145)
Total Bilirubin: 0.6 mg/dL (ref 0.2–1.2)
Total Protein: 7.1 g/dL (ref 6.0–8.3)

## 2022-02-04 LAB — CBC WITH DIFFERENTIAL/PLATELET
Basophils Absolute: 0 10*3/uL (ref 0.0–0.1)
Basophils Relative: 0.8 % (ref 0.0–3.0)
Eosinophils Absolute: 0.1 10*3/uL (ref 0.0–0.7)
Eosinophils Relative: 3.2 % (ref 0.0–5.0)
HCT: 41.7 % (ref 36.0–46.0)
Hemoglobin: 13.6 g/dL (ref 12.0–15.0)
Lymphocytes Relative: 40.2 % (ref 12.0–46.0)
Lymphs Abs: 1.4 10*3/uL (ref 0.7–4.0)
MCHC: 32.7 g/dL (ref 30.0–36.0)
MCV: 90.6 fl (ref 78.0–100.0)
Monocytes Absolute: 0.3 10*3/uL (ref 0.1–1.0)
Monocytes Relative: 8 % (ref 3.0–12.0)
Neutro Abs: 1.6 10*3/uL (ref 1.4–7.7)
Neutrophils Relative %: 47.8 % (ref 43.0–77.0)
Platelets: 166 10*3/uL (ref 150.0–400.0)
RBC: 4.6 Mil/uL (ref 3.87–5.11)
RDW: 13.5 % (ref 11.5–15.5)
WBC: 3.4 10*3/uL — ABNORMAL LOW (ref 4.0–10.5)

## 2022-02-04 LAB — LIPID PANEL
Cholesterol: 200 mg/dL (ref 0–200)
HDL: 58.2 mg/dL (ref >39.00)
LDL Cholesterol: 126 mg/dL — ABNORMAL HIGH (ref 0–99)
NonHDL: 141.39
Total CHOL/HDL Ratio: 3
Triglycerides: 75 mg/dL (ref 0.0–149.0)
VLDL: 15 mg/dL (ref 0.0–40.0)

## 2022-02-14 ENCOUNTER — Ambulatory Visit
Admission: RE | Admit: 2022-02-14 | Discharge: 2022-02-14 | Disposition: A | Discharge status: Home / Self Care | Payer: BC Managed Care – PPO | Source: Ambulatory Visit | Attending: Obstetrics and Gynecology | Admitting: Obstetrics and Gynecology

## 2022-02-14 DIAGNOSIS — N6310 Unspecified lump in the right breast, unspecified quadrant: Secondary | ICD-10-CM

## 2022-02-27 ENCOUNTER — Encounter (INDEPENDENT_AMBULATORY_CARE_PROVIDER_SITE_OTHER): Payer: Self-pay

## 2022-05-27 ENCOUNTER — Other Ambulatory Visit: Payer: Self-pay | Admitting: Surgery

## 2022-05-27 DIAGNOSIS — Z1239 Encounter for other screening for malignant neoplasm of breast: Secondary | ICD-10-CM

## 2022-07-19 ENCOUNTER — Encounter: Payer: Self-pay | Admitting: Surgery

## 2022-07-23 ENCOUNTER — Ambulatory Visit
Admission: RE | Admit: 2022-07-23 | Discharge: 2022-07-23 | Disposition: A | Discharge status: Home / Self Care | Payer: BC Managed Care – PPO | Source: Ambulatory Visit | Attending: Surgery | Admitting: Surgery

## 2022-07-23 DIAGNOSIS — Z1239 Encounter for other screening for malignant neoplasm of breast: Secondary | ICD-10-CM

## 2022-07-23 MED ORDER — GADOPICLENOL 0.5 MMOL/ML IV SOLN
9.0000 mL | Freq: Once | INTRAVENOUS | Status: AC | PRN
  Administered 2022-07-23: 9 mL via INTRAVENOUS

## 2023-06-17 ENCOUNTER — Encounter: Payer: BC Managed Care – PPO | Admitting: Family

## 2023-06-17 ENCOUNTER — Ambulatory Visit: Payer: BC Managed Care – PPO | Admitting: Family

## 2023-06-17 ENCOUNTER — Encounter: Payer: Self-pay | Admitting: Family

## 2023-06-17 VITALS — BP 110/70 | HR 88 | Resp 18 | Ht 62.0 in | Wt 232.8 lb

## 2023-06-17 DIAGNOSIS — R7309 Other abnormal glucose: Secondary | ICD-10-CM | POA: Diagnosis not present

## 2023-06-17 DIAGNOSIS — Z0000 Encounter for general adult medical examination without abnormal findings: Secondary | ICD-10-CM

## 2023-06-17 DIAGNOSIS — M79646 Pain in unspecified finger(s): Secondary | ICD-10-CM

## 2023-06-17 DIAGNOSIS — Z1231 Encounter for screening mammogram for malignant neoplasm of breast: Secondary | ICD-10-CM

## 2023-06-17 DIAGNOSIS — R5383 Other fatigue: Secondary | ICD-10-CM | POA: Diagnosis not present

## 2023-06-17 DIAGNOSIS — Z13220 Encounter for screening for lipoid disorders: Secondary | ICD-10-CM

## 2023-06-17 NOTE — Progress Notes (Signed)
Katherine Harris is a 62 y.o. female with the following history as recorded in [employer]:  Patient Active Problem List   Diagnosis Date Noted   Breast mass, right 08/09/2019   Breast pain, left 08/08/2019   Insomnia 04/29/2018   Metatarsalgia of left foot 05/30/2016   Pes planus of left foot 05/30/2016   Sinus tarsitis of left foot 05/30/2016   Chronic headaches 07/13/2014   Urge incontinence 04/18/2011    Current Outpatient Medications  Medication Sig Dispense Refill   Magnesium 100 MG TABS Take 200 mg by mouth.     naproxen (NAPROSYN) 500 MG tablet Take 1 tablet (500 mg total) by mouth 2 (two) times daily with a meal. (Patient taking differently: Take 100 mg by mouth once.) 60 tablet 1   No current facility-administered medications for this visit.    Allergies: Patient has no known allergies.  Past Medical History:  Diagnosis Date   Anemia    recently taking otc iron supplement per pt.   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 - no meds    Past Surgical History:  Procedure Laterality Date    esophageal  stretching  09/2010   ABDOMINAL HYSTERECTOMY     BREAST EXCISIONAL BIOPSY     bilateral   BREAST LUMPECTOMY Right 12/06/2013   Procedure: RIGHT BREAST LUMPECTOMY;  Surgeon: Mariella Saa, MD;  Location: [facility];  Service: General;  Laterality: Right;   CESAREAN SECTION  07/22/1990   CYSTOSCOPY  08/26/2011   Procedure: CYSTOSCOPY;  Surgeon: Myra C. Marice Potter, MD;  Location: [facility];  Service: Gynecology;  Laterality: N/A;   missed abortion     SALPINGOOPHORECTOMY  08/26/2011   Procedure: SALPINGO OOPHERECTOMY;  Surgeon: Myra C. Marice Potter, MD;  Location: [facility];  Service: Gynecology;  Laterality: Bilateral;   SVD  1997   x 1   UPPER  GASTROINTESTINAL ENDOSCOPY     WISDOM TOOTH EXTRACTION      Family History  Problem Relation Age of Onset   Hypertension Mother    Liver cancer Mother    Cancer Mother 49       Liver Cancer   Leukemia Father    Liver cancer Maternal Aunt 69       Breast Cancer one breast   Breast cancer Maternal Aunt    Cancer Paternal Grandmother 109       Breast Cancer   Breast cancer Paternal Grandmother    Cancer Cousin 35       Brain Cancer   Colon cancer Neg Hx    Stomach cancer Neg Hx     Social History   Tobacco Use   Smoking status: Never   Smokeless tobacco: Never  Substance Use Topics   Alcohol use: No    Subjective:   Presents for yearly CPE; is currently living in [place], [place]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   HENT: Negative.    Eyes: Negative.   Respiratory: Negative.    Cardiovascular: Negative.   Gastrointestinal: Negative.   Genitourinary: Negative.   Musculoskeletal:  Positive for joint pain.  Skin: Negative.   Neurological: Negative.   Endo/Heme/Allergies: Negative.   Psychiatric/Behavioral: Negative.       Objective:  Vitals:   06/17/23 1413  BP: 110/70  Pulse: 88  Resp: 18  SpO2: 97%  Weight: 232 lb 12.8 oz (105.6 kg)  Height: 5\' 2"  (1.575 m)    General: Well developed, well nourished, in no acute distress  Skin : Warm and dry.  Head: Normocephalic and atraumatic  Eyes: Sclera and conjunctiva clear; pupils round and reactive to light; extraocular movements intact  Ears: External normal; canals clear; tympanic membranes normal  Oropharynx: Pink, supple. No suspicious lesions  Neck: Supple without thyromegaly, adenopathy  Lungs: Respirations unlabored; clear to auscultation  bilaterally without wheeze, rales, rhonchi  CVS exam: normal rate and regular rhythm.  Abdomen: Soft; nontender; nondistended; normoactive bowel sounds; no masses or hepatosplenomegaly  Musculoskeletal: No deformities; no active joint inflammation  Extremities: No edema, cyanosis, clubbing  Vessels: Symmetric bilaterally  Neurologic: Alert and oriented; speech intact; face symmetrical; moves all extremities well; CNII-XII intact without focal deficit   Assessment:  1. PE (physical exam), annual   2. Lipid screening   3. Elevated glucose   4. Other fatigue   5. Thumb pain, unspecified laterality   6. Visit for screening mammogram     Plan:  Age appropriate preventive healthcare needs addressed; encouraged regular eye doctor and dental exams; encouraged regular exercise; orders for labs are updated and she will plan to do at later date when she is back in town;  Referral updated for screening mammogram and orthopedist to hopefully get done in December 2024.    No follow-ups on file.  Orders Placed This Encounter  Procedures   MM Digital Screening    Standing Status:   Future    Standing Expiration Date:   06/16/2024    Order Specific Question:   Reason for Exam (SYMPTOM  OR DIAGNOSIS REQUIRED)    Answer:   screening mammogram    Order Specific Question:   Is the patient pregnant?    Answer:   No    Order Specific Question:   Preferred imaging location?    Answer:   [facility]   CBC with Differential/Platelet    Standing Status:   Future    Standing Expiration Date:   06/16/2024   Comp Met (CMET)    Standing Status:   Future    Standing Expiration Date:   06/16/2024   Lipid panel    Standing Status:   Future    Standing Expiration Date:   06/16/2024   Hemoglobin A1c    Standing Status:   Future    Standing Expiration Date:   06/16/2024   TSH    Standing Status:   Future    Standing Expiration Date:   06/16/2024   Ambulatory referral to Orthopedic Surgery     Referral Priority:   Routine    Referral Type:   Surgical    Referral Reason:   Specialty Services Required    Requested Specialty:   Orthopedic Surgery    Number of Visits Requested:   1    Requested Prescriptions    No prescriptions requested or ordered in this encounter

## 2023-06-17 NOTE — Patient Instructions (Addendum)
Mammogram was due in July 2024- please let us know when you are ready to get this scheduled    The labs are ordered at [facility] location ( [address] [place])- go to the basement; no appointment needed; open from 7:30 am- 5:30 pm   You are due for Tdap and Zostavax- you can get at your local pharmacy in [place]

## 2023-07-01 ENCOUNTER — Encounter: Payer: BC Managed Care – PPO | Admitting: Family

## 2023-07-22 ENCOUNTER — Ambulatory Visit: Payer: BC Managed Care – PPO

## 2023-10-20 ENCOUNTER — Ambulatory Visit: Payer: Self-pay

## 2023-11-11 ENCOUNTER — Encounter: Payer: Self-pay | Admitting: Family

## 2023-11-11 ENCOUNTER — Ambulatory Visit
Admission: RE | Admit: 2023-11-11 | Discharge: 2023-11-11 | Disposition: A | Discharge status: Home / Self Care | Payer: Self-pay | Source: Ambulatory Visit | Attending: Family | Admitting: Family

## 2023-11-11 ENCOUNTER — Other Ambulatory Visit (INDEPENDENT_AMBULATORY_CARE_PROVIDER_SITE_OTHER)

## 2023-11-11 DIAGNOSIS — Z1231 Encounter for screening mammogram for malignant neoplasm of breast: Secondary | ICD-10-CM

## 2023-11-11 DIAGNOSIS — Z0000 Encounter for general adult medical examination without abnormal findings: Secondary | ICD-10-CM

## 2023-11-11 DIAGNOSIS — R7309 Other abnormal glucose: Secondary | ICD-10-CM | POA: Diagnosis not present

## 2023-11-11 DIAGNOSIS — Z13220 Encounter for screening for lipoid disorders: Secondary | ICD-10-CM

## 2023-11-11 DIAGNOSIS — R5383 Other fatigue: Secondary | ICD-10-CM | POA: Diagnosis not present

## 2023-11-11 LAB — COMPREHENSIVE METABOLIC PANEL WITH GFR
ALT: 8 U/L (ref 0–35)
AST: 14 U/L (ref 0–37)
Albumin: 4.1 g/dL (ref 3.5–5.2)
Alkaline Phosphatase: 63 U/L (ref 39–117)
BUN: 13 mg/dL (ref 6–23)
CO2: 28 meq/L (ref 19–32)
Calcium: 9.4 mg/dL (ref 8.4–10.5)
Chloride: 103 meq/L (ref 96–112)
Creatinine, Ser: 0.81 mg/dL (ref 0.40–1.20)
GFR: 78.88 mL/min (ref >60.00)
Glucose, Bld: 97 mg/dL (ref 70–99)
Potassium: 4.4 meq/L (ref 3.5–5.1)
Sodium: 138 meq/L (ref 135–145)
Total Bilirubin: 0.5 mg/dL (ref 0.2–1.2)
Total Protein: 7 g/dL (ref 6.0–8.3)

## 2023-11-11 LAB — CBC WITH DIFFERENTIAL/PLATELET
Basophils Absolute: 0 10*3/uL (ref 0.0–0.1)
Basophils Relative: 0.7 % (ref 0.0–3.0)
Eosinophils Absolute: 0.1 10*3/uL (ref 0.0–0.7)
Eosinophils Relative: 3.5 % (ref 0.0–5.0)
HCT: 44 % (ref 36.0–46.0)
Hemoglobin: 13.9 g/dL (ref 12.0–15.0)
Lymphocytes Relative: 37 % (ref 12.0–46.0)
Lymphs Abs: 1.4 10*3/uL (ref 0.7–4.0)
MCHC: 31.7 g/dL (ref 30.0–36.0)
MCV: 91.9 fl (ref 78.0–100.0)
Monocytes Absolute: 0.4 10*3/uL (ref 0.1–1.0)
Monocytes Relative: 9.6 % (ref 3.0–12.0)
Neutro Abs: 1.9 10*3/uL (ref 1.4–7.7)
Neutrophils Relative %: 49.2 % (ref 43.0–77.0)
Platelets: 182 10*3/uL (ref 150.0–400.0)
RBC: 4.79 Mil/uL (ref 3.87–5.11)
RDW: 13.7 % (ref 11.5–15.5)
WBC: 3.9 10*3/uL — ABNORMAL LOW (ref 4.0–10.5)

## 2023-11-11 LAB — LIPID PANEL
Cholesterol: 194 mg/dL (ref 0–200)
HDL: 50.6 mg/dL (ref >39.00)
LDL Cholesterol: 127 mg/dL — ABNORMAL HIGH (ref 0–99)
NonHDL: 143.59
Total CHOL/HDL Ratio: 4
Triglycerides: 81 mg/dL (ref 0.0–149.0)
VLDL: 16.2 mg/dL (ref 0.0–40.0)

## 2023-11-11 LAB — TSH: TSH: 1.59 u[IU]/mL (ref 0.35–5.50)

## 2023-11-11 LAB — HEMOGLOBIN A1C: Hgb A1c MFr Bld: 5.3 % (ref 4.6–6.5)

## 2024-02-06 ENCOUNTER — Encounter: Payer: Self-pay | Admitting: Advanced Practice Midwife
